# Patient Record
Sex: Female | Born: 1960 | Race: White | Hispanic: No | Smoking: Former smoker
Health system: Southern US, Community
[De-identification: ages and names within clinical notes are randomized; demographics above are authoritative.]

## PROBLEM LIST (undated history)

## (undated) DIAGNOSIS — R0600 Dyspnea, unspecified: Secondary | ICD-10-CM

## (undated) DIAGNOSIS — E559 Vitamin D deficiency, unspecified: Secondary | ICD-10-CM

## (undated) DIAGNOSIS — M170 Bilateral primary osteoarthritis of knee: Secondary | ICD-10-CM

## (undated) DIAGNOSIS — E785 Hyperlipidemia, unspecified: Secondary | ICD-10-CM

## (undated) HISTORY — PX: TUBAL LIGATION: SHX77

## (undated) HISTORY — DX: Bilateral primary osteoarthritis of knee: M17.0

## (undated) HISTORY — PX: TOTAL HIP ARTHROPLASTY: SHX124

## (undated) HISTORY — DX: Vitamin D deficiency, unspecified: E55.9

## (undated) HISTORY — DX: Hyperlipidemia, unspecified: E78.5

---

## 1983-07-15 HISTORY — PX: TUBAL LIGATION: SHX77

## 2017-11-24 DIAGNOSIS — M179 Osteoarthritis of knee, unspecified: Secondary | ICD-10-CM | POA: Insufficient documentation

## 2019-07-13 ENCOUNTER — Other Ambulatory Visit: Payer: Self-pay | Admitting: Primary Care

## 2019-07-13 DIAGNOSIS — R42 Dizziness and giddiness: Secondary | ICD-10-CM

## 2019-07-20 ENCOUNTER — Ambulatory Visit
Admission: RE | Admit: 2019-07-20 | Discharge: 2019-07-20 | Disposition: A | Discharge status: Home / Self Care | Payer: Medicaid Other | Source: Ambulatory Visit | Attending: Primary Care | Admitting: Primary Care

## 2019-07-20 ENCOUNTER — Other Ambulatory Visit: Payer: Self-pay

## 2019-07-20 DIAGNOSIS — R42 Dizziness and giddiness: Secondary | ICD-10-CM

## 2019-08-03 ENCOUNTER — Ambulatory Visit: Payer: Medicaid Other | Admitting: Internal Medicine

## 2019-08-17 ENCOUNTER — Encounter: Payer: Self-pay | Admitting: Internal Medicine

## 2019-08-17 ENCOUNTER — Other Ambulatory Visit: Payer: Self-pay

## 2019-08-17 ENCOUNTER — Ambulatory Visit (INDEPENDENT_AMBULATORY_CARE_PROVIDER_SITE_OTHER): Payer: Medicaid Other | Admitting: Internal Medicine

## 2019-08-17 VITALS — BP 140/84 | HR 76 | Ht 62.0 in | Wt 260.8 lb

## 2019-08-17 DIAGNOSIS — R079 Chest pain, unspecified: Secondary | ICD-10-CM | POA: Diagnosis not present

## 2019-08-17 DIAGNOSIS — E785 Hyperlipidemia, unspecified: Secondary | ICD-10-CM | POA: Diagnosis not present

## 2019-08-17 DIAGNOSIS — I6523 Occlusion and stenosis of bilateral carotid arteries: Secondary | ICD-10-CM

## 2019-08-17 DIAGNOSIS — Z13220 Encounter for screening for lipoid disorders: Secondary | ICD-10-CM | POA: Insufficient documentation

## 2019-08-17 DIAGNOSIS — R0789 Other chest pain: Secondary | ICD-10-CM | POA: Diagnosis not present

## 2019-08-17 DIAGNOSIS — R42 Dizziness and giddiness: Secondary | ICD-10-CM | POA: Insufficient documentation

## 2019-08-17 MED ORDER — ASPIRIN EC 81 MG PO TBEC: 81.0000 mg | DELAYED_RELEASE_TABLET | Freq: Every day | ORAL | 3 refills | Status: DC

## 2019-08-17 NOTE — Patient Instructions (Signed)
Medication Instructions:  Your physician has recommended you make the following change in your medication:  1- START Aspirin 81 mg by mouth once a day. (over-the-counter).  *If you need a refill on your cardiac medications before your next appointment, please call your pharmacy*  Lab Work: Your physician recommends that you return for lab work in: TODAY - LIPID, Direct LDL.  If you have labs (blood work) drawn today and your tests are completely normal, you will receive your results only by: Marland Kitchen MyChart Message (if you have MyChart) OR . A paper copy in the mail If you have any lab test that is abnormal or we need to change your treatment, we will call you to review the results.  Testing/Procedures: Your physician has requested that you have an echocardiogram. Echocardiography is a painless test that uses sound waves to create images of your heart. It provides your doctor with information about the size and shape of your heart and how well your heart's chambers and valves are working. This procedure takes approximately one hour. There are no restrictions for this procedure. You may get an IV, if needed, to receive an ultrasound enhancing agent through to better visualize your heart.   Follow-Up: At [facility], you and your health needs are our priority.  As part of our continuing mission to provide you with exceptional heart care, we have created designated Provider Care Teams.  These Care Teams include your primary Cardiologist (physician) and Advanced Practice Providers (APPs -  Physician Assistants and Nurse Practitioners) who all work together to provide you with the care you need, when you need it.  Your next appointment:   1 month(s)  The format for your next appointment:   In Person  Provider:    You may see one of the following Advanced Practice Providers on your designated Care Team:    Nicolasa Ducking, NP  Eula Listen, PA-C  Marisue Ivan,  PA-C   Echocardiogram An echocardiogram is a procedure that uses painless sound waves (ultrasound) to produce an image of the heart. Images from an echocardiogram can provide important information about:  Signs of coronary artery disease (CAD).  Aneurysm detection. An aneurysm is a weak or damaged part of an artery wall that bulges out from the normal force of blood pumping through the body.  Heart size and shape. Changes in the size or shape of the heart can be associated with certain conditions, including heart failure, aneurysm, and CAD.  Heart muscle function.  Heart valve function.  Signs of a past heart attack.  Fluid buildup around the heart.  Thickening of the heart muscle.  A tumor or infectious growth around the heart valves. Tell a health care provider about:  Any allergies you have.  All medicines you are taking, including vitamins, herbs, eye drops, creams, and over-the-counter medicines.  Any blood disorders you have.  Any surgeries you have had.  Any medical conditions you have.  Whether you are pregnant or may be pregnant. What are the risks? Generally, this is a safe procedure. However, problems may occur, including:  Allergic reaction to dye (contrast) that may be used during the procedure. What happens before the procedure? No specific preparation is needed. You may eat and drink normally. What happens during the procedure?   An IV tube may be inserted into one of your veins.  You may receive contrast through this tube. A contrast is an injection that improves the quality of the pictures from your heart.  A  gel will be applied to your chest.  A wand-like tool (transducer) will be moved over your chest. The gel will help to transmit the sound waves from the transducer.  The sound waves will harmlessly bounce off of your heart to allow the heart images to be captured in real-time motion. The images will be recorded on a computer. The procedure  may vary among health care providers and hospitals. What happens after the procedure?  You may return to your normal, everyday life, including diet, activities, and medicines, unless your health care provider tells you not to do that. Summary  An echocardiogram is a procedure that uses painless sound waves (ultrasound) to produce an image of the heart.  Images from an echocardiogram can provide important information about the size and shape of your heart, heart muscle function, heart valve function, and fluid buildup around your heart.  You do not need to do anything to prepare before this procedure. You may eat and drink normally.  After the echocardiogram is completed, you may return to your normal, everyday life, unless your health care provider tells you not to do that. This information is not intended to replace advice given to you by your health care provider. Make sure you discuss any questions you have with your health care provider. Document Revised: 10/21/2018 Document Reviewed: 08/02/2016 Elsevier Patient Education  [employer].

## 2019-08-17 NOTE — Progress Notes (Signed)
New Outpatient Visit Date: 08/17/2019  Referring Provider: Sandrea Hughs, NP [address] [place],  [place] [address]  Chief Complaint: Dizziness  HPI:  Chloe Farley is a 59 y.o. female who is being seen today for the evaluation of dizziness at the request of Sandrea Hughs, NP. She has a history of hyperlipidemia, vitamin D deficiency, and arthritis.  Chloe Farley was diagnosed with COVID-19 last month and reports that she began feeling dizziness a few weeks before this.  She describes the sensation of lightheadedness and occasional blackening of her vision lasting a few seconds at a time.  She also felt somewhat unstable on her feet, though she never fell or passed out.  There were no associated symptoms including palpitations, chest pain, and shortness of breath.  She had one prolonged episode of dizziness that lasted 2 to 3 days.  She was prescribed meclizine with some transient improvement in the dizziness.  Chloe. Farley denies a history of prior cardiac disease.  She has experienced occasional cramping pain extending from just below the left breast to the left lower abdomen when she bends forward.  She otherwise denies chest pain, including with activity.  She notes that she has gained 23 pounds over the last several months since quitting smoking.  She reports having undergone heart testing during the hospitalization in [place] 5 to 10 years ago.  To her knowledge, the tests were normal.  She has been treated with multiple cholesterol medicines in the past but did not tolerate any of them due to cramping and muscle pain.  Chloe. Farley tries to stay well-hydrated, drinking around 6 bottles of water per day.  She has cut coffee down to 2 cups/day.  She otherwise does not consume caffeine.  She does not have any pain in the left arm at rest or with movement.  --------------------------------------------------------------------------------------------------  Cardiovascular History & Procedures:  Cardiovascular Problems:  Lightheadedness  Risk Factors:  Carotid atherosclerosis, hyperlipidemia, family history, and morbid obesity  Cath/PCI:  None  CV Surgery:  None  EP Procedures and Devices:  None  Non-Invasive Evaluation(s):  Carotid Doppler (07/20/2019): Mild heterogeneous and calcified plaque involving both carotid arteries without hemodynamically significant stenosis.  Suggestion of developing stenosis involving the left subclavian artery.  --------------------------------------------------------------------------------------------------  Past Medical History:  Diagnosis Date  . Hyperlipidemia   . Osteoarthritis of both knees   . Vitamin D deficiency     Past Surgical History:  Procedure Laterality Date  . TOTAL HIP ARTHROPLASTY     right & left   . TUBAL LIGATION      Current Meds  Medication Sig  . Cholecalciferol (DIALYVITE VITAMIN D 5000 PO) Take 10,000 Units by mouth daily.  . Flaxseed, Linseed, (FLAXSEED OIL) 1200 MG CAPS Take 1,200 mg by mouth daily.  Marland Kitchen loratadine (CLARITIN) 10 MG tablet loratadine 10 mg tablet  TAKE 1 TABLET BY MOUTH ONCE DAILY FOR ALLERGIES  . Moringa 500 MG CAPS Take 1,000 mg elemental calcium/kg/hr by mouth daily.  . TURMERIC PO Take by mouth daily.    Allergies: Other, Rofecoxib, and Meloxicam  Social History   Tobacco Use  . Smoking status: Former Smoker    Packs/day: 1.00    Years: 15.00    Pack years: 15.00    Types: Cigarettes    Quit date: 01/31/2019    Years since quitting: 0.5  . Smokeless tobacco: Never Used  Substance Use Topics  . Alcohol use: Never  . Drug use: Not on file  Family History  Problem Relation Age of Onset  . Cancer Mother   . Hyperlipidemia Father   . Heart attack Father   . Hypertension Sister   . Hyperlipidemia Sister   . Head & neck cancer Brother     Review of Systems: A 12-system review of systems was performed and was negative except as noted in the HPI.   --------------------------------------------------------------------------------------------------  Physical Exam: BP 140/84 (BP Location: Right Arm, Patient Position: Sitting, Cuff Size: Large)   Pulse 76   Ht 5\' 2"  (1.575 m)   Wt 260 lb 12 oz (118.3 kg)   SpO2 94%   BMI 47.69 kg/m   Right arm blood pressure: 140/84; left arm blood pressure: 140/86 Position Blood pressure (mmHg) Heart rate (bpm)  Lying  137/79  76  Sitting  110/72  79  Standing  110/74  86  Standing (3 minutes)  117/83  84    General: NAD. HEENT: No conjunctival pallor or scleral icterus. Facemask in place. Neck: Supple without lymphadenopathy, thyromegaly, JVD, or HJR.  Left carotid and shoulder bruit; question if left carotid bruit is radiated from the subclavian artery Lungs: Normal work of breathing. Clear to auscultation bilaterally without wheezes or crackles. Heart: Regular rate and rhythm without murmurs, rubs, or gallops.  Unable to assess PMI due to body habitus. Abd: Bowel sounds present. Soft, NT/ND without.  Unable to assess HSM due to body habitus. Ext: Trace pretibial edema bilaterally. Radial, PT, and DP pulses are 2+ bilaterally Skin: Warm and dry without rash. Neuro: CNIII-XII intact. Strength and fine-touch sensation intact in upper and lower extremities bilaterally. Psych: Normal mood and affect.  EKG: Normal sinus rhythm with possible left atrial enlargement.  Otherwise, no significant abnormality.  Outside labs (06/28/2019): CBC: White blood cell count 9.1, hemoglobin 14.9, hematocrit 45.2, platelets 281  CMP: Sodium 141, potassium 4.7, chloride 99, CO2 25, BUN 16, creatinine 0.92, glucose 83, calcium 9.7, AST 15, ALT 18, alkaline phosphatase 91, total bilirubin 0.4, total protein 7.1, albumin 4.4  TSH: 0.889  --------------------------------------------------------------------------------------------------  ASSESSMENT AND PLAN: Lightheadedness: Symptoms are nonspecific and not  positional, as would be expected with orthostatic hypotension.  Of note, significant drop in blood pressure from lying to sitting was observed in the office today.  Recent carotid Doppler showed mild bilateral carotid artery plaquing as well as potential left subclavian artery stenosis.  I do not believe these findings in isolation explain the patient's dizziness.  I have recommended a transthoracic echocardiogram to exclude structural cardiac abnormalities that could be contributing.  I also encouraged Chloe. Grape to stay well-hydrated.  It is possible that recent COVID-19 infection may have also contributed to her symptoms.  Hopefully, lightheadedness will improve as she recovers from this infection.  Atypical chest pain: Chest pain is positional, occurring primarily when the patient bends over.  This is most consistent with musculoskeletal etiology.  However, given another symptoms outlined above, we will obtain a transthoracic echocardiogram.  I do not believe that ischemia evaluation is necessary at this time, though we will need to readdress this if she has more typical chest pain in the future or evidence of cardiomyopathy on echo.  Carotid artery stenosis and hyperlipidemia: The patient reports a history of hyperlipidemia with intolerance to multiple cholesterol medications in the past.  The only when she remembers my name is atorvastatin.  We do not have a recent lipid panel; we will check a lipid panel today.  I have advised Chloe. Hay that she will likely  need to reconsider lipid therapy, given evidence of atherosclerotic cardiovascular disease (carotid artery and likely left subclavian artery atherosclerosis), though she is resistant to this.  We will await the results of her lipid panel and work on lifestyle modifications in the meantime.  I do not believe that further imaging of the left subclavian artery is needed at this time, given that there is no significant blood pressure differential  between the right and left arms.  She also does not have symptoms of ischemia in the left arm.  I have recommended that Chloe. Brayboy begin taking aspirin 81 mg daily.  Morbid obesity: BMI greater than 40 with comorbidities (ASCVD and hyperlipidemia).  Weight loss encouraged through diet and exercise.  Follow-up: Return to clinic in 1 month.  Yvonne Kendall, MD 08/17/2019 1:31 PM

## 2019-08-18 LAB — LDL CHOLESTEROL, DIRECT: LDL Direct: 212 mg/dL — ABNORMAL HIGH (ref 0–99)

## 2019-08-18 LAB — LIPID PANEL
Chol/HDL Ratio: 4.3 ratio (ref 0.0–4.4)
Cholesterol, Total: 290 mg/dL — ABNORMAL HIGH (ref 100–199)
HDL: 67 mg/dL (ref >39)
LDL Chol Calc (NIH): 201 mg/dL — ABNORMAL HIGH (ref 0–99)
Triglycerides: 124 mg/dL (ref 0–149)
VLDL Cholesterol Cal: 22 mg/dL (ref 5–40)

## 2019-09-07 ENCOUNTER — Other Ambulatory Visit: Payer: Self-pay

## 2019-09-07 ENCOUNTER — Ambulatory Visit (INDEPENDENT_AMBULATORY_CARE_PROVIDER_SITE_OTHER): Payer: Medicaid Other

## 2019-09-07 DIAGNOSIS — R42 Dizziness and giddiness: Secondary | ICD-10-CM | POA: Diagnosis not present

## 2019-09-07 DIAGNOSIS — R0789 Other chest pain: Secondary | ICD-10-CM

## 2019-09-07 MED ORDER — PERFLUTREN LIPID MICROSPHERE: 1.0000 mL | INTRAVENOUS | Status: AC | PRN

## 2019-09-22 ENCOUNTER — Encounter: Payer: Self-pay | Admitting: Internal Medicine

## 2019-09-22 ENCOUNTER — Telehealth: Payer: Self-pay | Admitting: Internal Medicine

## 2019-09-22 ENCOUNTER — Other Ambulatory Visit: Payer: Self-pay

## 2019-09-22 ENCOUNTER — Ambulatory Visit (INDEPENDENT_AMBULATORY_CARE_PROVIDER_SITE_OTHER): Payer: Medicaid Other | Admitting: Internal Medicine

## 2019-09-22 VITALS — BP 110/60 | HR 89 | Ht 62.0 in | Wt 264.8 lb

## 2019-09-22 DIAGNOSIS — E7800 Pure hypercholesterolemia, unspecified: Secondary | ICD-10-CM | POA: Diagnosis not present

## 2019-09-22 DIAGNOSIS — Z79899 Other long term (current) drug therapy: Secondary | ICD-10-CM

## 2019-09-22 DIAGNOSIS — I2510 Atherosclerotic heart disease of native coronary artery without angina pectoris: Secondary | ICD-10-CM | POA: Diagnosis not present

## 2019-09-22 MED ORDER — BEMPEDOIC ACID 180 MG PO TABS: 1.0000 | ORAL_TABLET | Freq: Every day | ORAL | 5 refills | Status: DC

## 2019-09-22 NOTE — Progress Notes (Signed)
Follow-up Outpatient Visit Date: 09/22/2019  Primary Care Provider: Freddy Finner, NP [place] [address]  Chief Complaint: Follow-up dizziness  HPI:  Chloe Farley is a 59 y.o. female with history of hyperlipidemia, vitamin D deficiency, and arthritis, who presents for follow-up of dizziness.  I met her a month ago for evaluation of dizziness.  Shortly after onset of symptoms, she was diagnosed with COVID-19.  Orthostatic hypotension was noted in the office.  Preceding carotid Doppler was notable for mild bilateral carotid artery plaquing as well as potential stenosis in the left subclavian artery.  Subsequent echo showed preserved LVEF with grade 1 diastolic dysfunction and mildly elevated PA pressure.  No significant abnormality was seen to explain dizziness, though on my personal review of the echo, at least mild aortic regurgitation is noted.  Today, Ms. Baumert reports that her dizziness has improved.  She denies chest pain, shortness of breath, palpitations, lightheadedness, edema, and arm pain.  She notes that she has taken several statins in the past, including atorvastatin and rosuvastatin, and did not tolerate any of them due to severe myalgias involving her legs and feet.  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  Risk Factors:  Carotid atherosclerosis, hyperlipidemia, family history, and morbid obesity  Cath/PCI:  None  CV Surgery:  None  EP Procedures and Devices:  None  Non-Invasive Evaluation(s):  TTE (09/07/2019): Normal LV size and wall thickness.  LVEF 60-65% with grade 1 diastolic dysfunction.  Normal RV size and function with mildly elevated pulmonary artery pressure.  There is probably at  least mild aortic regurgitation.  Carotid Doppler (07/20/2019): Mild heterogeneous and calcified plaque involving both carotid arteries without hemodynamically significant stenosis.  Suggestion of developing stenosis involving the left subclavian artery.  Recent CV Pertinent Labs: Lab Results  Component Value Date   CHOL 290 (H) 08/17/2019   HDL 67 08/17/2019   LDLCALC 201 (H) 08/17/2019   LDLDIRECT 212 (H) 08/17/2019   TRIG 124 08/17/2019   CHOLHDL 4.3 08/17/2019    Past medical and surgical history were reviewed and updated in EPIC.  Current Meds  Medication Sig  . aspirin EC 81 MG tablet Take 1 tablet (81 mg total) by mouth daily.  . Cholecalciferol (DIALYVITE VITAMIN D 5000 PO) Take 5,000 Units by mouth daily. Taking 1 capsule daily.  . Flaxseed, Linseed, (FLAXSEED OIL) 1200 MG CAPS Take 1,200 mg by mouth daily.  Marland Kitchen loratadine (CLARITIN) 10 MG tablet loratadine 10 mg tablet  TAKE 1 TABLET BY MOUTH ONCE DAILY FOR ALLERGIES  . Moringa 500 MG CAPS Take 1,000 mg elemental calcium/kg/hr by mouth daily.  . TURMERIC PO Take by mouth daily.    Allergies: Other, Rofecoxib, and Meloxicam  Social History   Tobacco Use  . Smoking status: Former Smoker    Packs/day: 1.00    Years: 15.00    Pack years: 15.00    Types: Cigarettes    Quit date: 01/31/2019    Years since quitting: 0.6  . Smokeless tobacco: Never Used  Substance Use Topics  . Alcohol use: Never  . Drug use: Not on file    Family History  Problem Relation Age of Onset  . Cancer Mother   . Hyperlipidemia Father   . Heart attack Father   . Hypertension Sister   . Hyperlipidemia Sister   . Head & neck cancer Brother  Review of Systems: A 12-system review of systems was performed and was negative except as noted in the HPI.  --------------------------------------------------------------------------------------------------  Physical Exam: BP 110/60 (BP Location: Left Arm, Patient Position: Sitting, Cuff Size:  Normal)   Pulse 89   Ht _0  (1.575 m)   Wt 264 lb 12 oz (120.1 kg)   SpO2 95%   BMI 48.42 kg/m   General: NAD. Neck: No JVD or HJR, though evaluation is limited by body habitus. Lungs: Clear bilaterally without wheezes or crackles. Heart: Distant heart sounds.  Regular rate and rhythm without murmurs. Abdomen: Obese and soft. Extremities: No lower extremity edema.  2+ radial pulses bilaterally.  EKG: Normal sinus rhythm without abnormality.  Lab Results  Component Value Date   CHOL 290 (H) 08/17/2019   HDL 67 08/17/2019   LDLCALC 201 (H) 08/17/2019   LDLDIRECT 212 (H) 08/17/2019   TRIG 124 08/17/2019   CHOLHDL 4.3 08/17/2019    --------------------------------------------------------------------------------------------------  ASSESSMENT AND PLAN: ASCVD: Ms. Foell is asymptomatic but has been noted to have mild carotid artery plaquing as well as likely stenosis involving the left subclavian artery with Doppler showing turbulent flow and late systolic reversal in the left vertebral artery.  She is asymptomatic.  However, we discussed the importance of medical therapy and lifestyle modifications to prevent progression of disease.  Lipid panel after our last visit was notable for marked hyperlipidemia with a direct LDL of 212.  This is consistent with heterozygous familial hyperlipidemia.  Given that there is clinical evidence of developing ASCVD as well, I have recommended aggressive lipid therapy.  Ms. Depaul notes that she has tried multiple statins in the past and was intolerant of all of them, including atorvastatin and rosuvastatin.  We discussed alternative treatment options including ezetimibe, PCSK9 inhibitor, and bempedoic acid.  I do not believe that ezetimibe will provide adequate LDL lowering to significantly decrease her cardiovascular risk.  Ms. Casebolt is resistant to an injectable medication.  We will therefore initiate bempedoic acid 180 mg daily with follow-up lipid  panel, ALT, and CBC 6 weeks after beginning therapy.  We will continue with aspirin 81 mg daily.  Familial hyperlipidemia: As above, recent direct LDL was elevated at 212.  She notes that her father also had severely elevated cholesterol complicated by coronary artery disease and MI.  We will initiate bempedoic acid given history of intolerance to multiple statins.  Repeat labs to be done in 6 weeks after initiation of therapy.  Morbid obesity: BMI greater than 40.  I have encouraged Ms. Ruperto to lose weight through diet and exercise.  Follow-up: Return to clinic in 3 months.  Nelva Bush, MD 09/23/2019 2:55 PM

## 2019-09-22 NOTE — Patient Instructions (Signed)
Medication Instructions:  Your physician has recommended you make the following change in your medication:  1. Start Bempedoic Acid 180 mg take one tablet by mouth once daily.  *If you need a refill on your cardiac medications before your next appointment, please call your pharmacy*   Lab Work: Your physician recommends that you return for lab work in: Six weeks after you start taking the Bempedoic Acid. Come to the medical mall. No appointment needed and the hours are 7am-6pm. CBC, ALT, LIPID  If you have labs (blood work) drawn today and your tests are completely normal, you will receive your results only by: Marland Kitchen MyChart Message (if you have MyChart) OR . A paper copy in the mail If you have any lab test that is abnormal or we need to change your treatment, we will call you to review the results.   Testing/Procedures:  None   Follow-Up: At [facility], you and your health needs are our priority.  As part of our continuing mission to provide you with exceptional heart care, we have created designated Provider Care Teams.  These Care Teams include your primary Cardiologist (physician) and Advanced Practice Providers (APPs -  Physician Assistants and Nurse Practitioners) who all work together to provide you with the care you need, when you need it.  We recommend signing up for the patient portal called "MyChart".  Sign up information is provided on this After Visit Summary.  MyChart is used to connect with patients for Virtual Visits (Telemedicine).  Patients are able to view lab/test results, encounter notes, upcoming appointments, etc.  Non-urgent messages can be sent to your provider as well.   To learn more about what you can do with MyChart, go to [email or web address].    Your next appointment:   3 month(s)  The format for your next appointment:   In Person  Provider:    You may see Dr End or one of the following Advanced Practice Providers on your designated Care Team:     Nicolasa Ducking, NP  Eula Listen, PA-C  Marisue Ivan, PA-C

## 2019-09-22 NOTE — Telephone Encounter (Signed)
No answer with pharmacy. Left message to call back.

## 2019-09-22 NOTE — Telephone Encounter (Signed)
Pt c/o medication issue:  1. Name of Medication: bempedoic acid 180 mg po q d   2. How are you currently taking this medication (dosage and times per day)?  180 mg po q d   3. Are you having a reaction (difficulty breathing--STAT)? No  4. What is your medication issue? Pharmacy calling to let us know insurance will not cover and they do not stock

## 2019-09-23 ENCOUNTER — Encounter: Payer: Self-pay | Admitting: Internal Medicine

## 2019-09-23 MED ORDER — BEMPEDOIC ACID 180 MG PO TABS: 1.0000 | ORAL_TABLET | Freq: Every day | ORAL | 1 refills | Status: DC

## 2019-09-23 NOTE — Telephone Encounter (Signed)
Discussed with Malena Peer, PharmD, who advised to resend to pharmacy and do the prior auth through [facility] when ready.  Rx sent. Await prior auth.

## 2019-09-23 NOTE — Telephone Encounter (Signed)
No answer with [facility]. Left message to call back.   Called patient to see if there is another pharmacy I can send bempedoic acid to since [facility] does not stock. She said the [facility] in [place].  Advised that it may take some time to go through the process and to check back with me in about 1 week.  Patient is on Medicaid. Routing to pharmacy for advice on this process.

## 2019-09-26 NOTE — Telephone Encounter (Signed)
Spoke with pharmacy - [facility]. She ran her [employer] and it does show the need to complete prior auth through [employer]. She is faxing [place] the request now.

## 2019-09-26 NOTE — Telephone Encounter (Signed)
Spoke with Harvest Dark at [facility] to initiate Prior Auth for nexletol 180mg . Interaction ID# PA has been approved effective 09/26/2019 through 09/20/2020. PA approval # 11/20/2020 Pal at [facility] has been informed. ($3 Copay) They will order the medication and have it ready for pickup tomorrow afternoon. Pal states she will call the patient to let her know.

## 2020-01-10 NOTE — Progress Notes (Signed)
Follow-up Outpatient Visit Date: 01/11/2020  Primary Care Provider: Sandrea Hughs, NP [address] [place] [address]  Chief Complaint: Follow-up hyperlipidemia and dizziness  HPI:  Ms. Overholser is a 59 y.o. female with history of hyperlipidemia, vitamin D deficiency, and arthritis, who presents for follow-up of hyperlipidemia.  I last saw her in March, at which time Ms. Charise Killian noted that her dizziness had improved.  I recommended initiation of lipid therapy given mild plaquing in the carotid arteries as well as potential left subclavian artery stenosis in the setting of severely elevated LDL.  Ms. Charise Killian has been intolerant of multiple statins and did not wish to be rechallenged.  She was also reluctant to an injectable medication.  We therefore elected to add bempedoic acid.  Today, Ms. Steinmiller reports that her dizziness has resolved with addition of low-dose aspirin.  She stopped taking bempedoic acid after about a week, as she felt like it was causing leg cramps.  She had to apply several topical treatments to help with the leg cramps, though they ultimately seem to resolve when she began taking turmeric.  She does not wish to rechallenge with any lipid therapy at this time.  She is walking about 10 to 15 minutes a day but admits that she does not follow a healthy diet.  Ms. Abdulaziz denies chest pain, shortness of breath, palpitations, lightheadedness, and edema.  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  Risk Factors:  Carotid atherosclerosis, hyperlipidemia, family history, and morbid obesity  Cath/PCI:  None  CV Surgery:  None  EP Procedures and Devices:  None  Non-Invasive Evaluation(s):  TTE (09/07/2019): Normal LV  size and wall thickness.  LVEF 60-65% with grade 1 diastolic dysfunction.  Normal RV size and function with mildly elevated pulmonary artery pressure.  There is probably at least mild aortic regurgitation.  Carotid Doppler (07/20/2019): Mild heterogeneous and calcified plaque involving both carotid arteries without hemodynamically significant stenosis. Suggestion of developing stenosis involving the left subclavian artery.  Recent CV Pertinent Labs: Lab Results  Component Value Date   CHOL 290 (H) 08/17/2019   HDL 67 08/17/2019   LDLCALC 201 (H) 08/17/2019   LDLDIRECT 212 (H) 08/17/2019   TRIG 124 08/17/2019   CHOLHDL 4.3 08/17/2019    Past medical and surgical history were reviewed and updated in EPIC.  Current Meds  Medication Sig  . aspirin EC 81 MG tablet Take 1 tablet (81 mg total) by mouth daily.  . Flaxseed, Linseed, (FLAXSEED OIL) 1200 MG CAPS Take 1,200 mg by mouth daily.  . TURMERIC PO Take 2 capsules by mouth daily.     Allergies: Other, Rofecoxib, Sulfa antibiotics, and Meloxicam  Social History   Tobacco Use  . Smoking status: Former Smoker    Packs/day: 1.00    Years: 15.00    Pack years: 15.00    Types: Cigarettes    Quit date: 01/31/2019    Years since quitting: 0.9  . Smokeless tobacco: Never Used  Vaping Use  . Vaping Use: Never used  Substance Use Topics  . Alcohol use: Never  . Drug use: Not on file    Family History  Problem Relation Age of Onset  . Cancer Mother   . Hyperlipidemia Father   . Heart attack Father   . Hypertension Sister   . Hyperlipidemia Sister   . Head & neck cancer Brother  Review of Systems: A 12-system review of systems was performed and was negative except as noted in the HPI.  --------------------------------------------------------------------------------------------------  Physical Exam: BP 120/64 (BP Location: Left Arm, Patient Position: Sitting, Cuff Size: Large)   Pulse 73   Ht 5\' 3"  (1.6 m)   Wt 257 lb 6  oz (116.7 kg)   SpO2 98%   BMI 45.59 kg/m   General: NAD. Neck: No JVD or HJR. Lungs: Clear to auscultation without wheezes or crackles. Heart: Regular rate and rhythm without murmurs, rubs, or gallops. Abdomen: Soft, nontender, nondistended. Extremities: No lower extremity edema.  2+ pedal pulses bilaterally.  EKG: Normal sinus rhythm without abnormality.  Lab Results  Component Value Date   CHOL 290 (H) 08/17/2019   HDL 67 08/17/2019   LDLCALC 201 (H) 08/17/2019   LDLDIRECT 212 (H) 08/17/2019   TRIG 124 08/17/2019   CHOLHDL 4.3 08/17/2019    --------------------------------------------------------------------------------------------------  ASSESSMENT AND PLAN: Hyperlipidemia: Unfortunately, Ms. Wassel remains intolerant of multiple agents to control her lipids, including recent trial of bempedoic acid.  I am not certain that her intermittent leg cramps, particularly at night, are attributable to bempedoic acid.  We discussed rechallenging her with this versus trying a PCSK9 inhibitor, though Ms. Inclan declines both options.  She would like to work on diet and exercise.  I have provided her with information regarding the Mediterranean diet.  Given history of markedly elevated LDL, left subclavian artery stenosis, and carotid artery plaquing, she is at high risk for future cardiovascular events if her LDL remains uncontrolled.  We will plan to check a follow-up lipid panel to assess her progress in 6 months.  Leg cramps: No evidence of PAD by exam today.  Nature of discomfort is also inconsistent with high-grade PAD.  We discussed obtaining ABIs but have agreed to defer this.  I will check a basic metabolic panel and magnesium level today to ensure normal electrolytes.  I encouraged Ms. Tribby to stay well-hydrated.  Lightheadedness: Symptoms have resolved with addition of low-dose aspirin.  Work-up thus far showed mild carotid stenosis and normal LVEF.  If symptoms were to recur,  we may need to investigate her left subclavian artery stenosis further, as dizziness can sometimes be caused by subclavian steal.  However, given the lack of ongoing symptoms, we have agreed to defer additional work-up at this time.  Morbid obesity: BMI greater than 40.  I encouraged weight loss through diet and exercise.  Follow-up: Return to clinic in 6 months.  Cleora Fleet, MD 01/11/2020 7:59 AM

## 2020-01-11 ENCOUNTER — Ambulatory Visit: Payer: Medicaid Other | Admitting: Internal Medicine

## 2020-01-11 ENCOUNTER — Encounter: Payer: Self-pay | Admitting: Internal Medicine

## 2020-01-11 ENCOUNTER — Other Ambulatory Visit: Payer: Self-pay

## 2020-01-11 VITALS — BP 120/64 | HR 73 | Ht 63.0 in | Wt 257.4 lb

## 2020-01-11 DIAGNOSIS — R42 Dizziness and giddiness: Secondary | ICD-10-CM | POA: Diagnosis not present

## 2020-01-11 DIAGNOSIS — E7800 Pure hypercholesterolemia, unspecified: Secondary | ICD-10-CM

## 2020-01-11 DIAGNOSIS — R252 Cramp and spasm: Secondary | ICD-10-CM | POA: Diagnosis not present

## 2020-01-11 NOTE — Patient Instructions (Addendum)
Medication Instructions:  Your physician recommends that you continue on your current medications as directed. Please refer to the Current Medication list given to you today.  *If you need a refill on your cardiac medications before your next appointment, please call your pharmacy*   Lab Work: 1- Your physician recommends that you return for lab work in: TODAY - BMET, Mg.   2- Your physician recommends that you return for lab work in: 6 MONTHS PRIOR TO 6 MONTH APPT.  - You will need to be fasting. Please do not have anything to eat or drink after midnight the morning you have the lab work. You may only have water or black coffee with no cream or sugar. - Please go to the [facility] You will check in at the front desk to the right as you walk into the atrium. Valet Parking is offered if needed. - No appointment needed. You may go any day between 7 am and 6 pm.  If you have labs (blood work) drawn today and your tests are completely normal, you will receive your results only by: Marland Kitchen MyChart Message (if you have MyChart) OR . A paper copy in the mail If you have any lab test that is abnormal or we need to change your treatment, we will call you to review the results.   Testing/Procedures: none   Follow-Up: At [facility], you and your health needs are our priority.  As part of our continuing mission to provide you with exceptional heart care, we have created designated Provider Care Teams.  These Care Teams include your primary Cardiologist (physician) and Advanced Practice Providers (APPs -  Physician Assistants and Nurse Practitioners) who all work together to provide you with the care you need, when you need it.  We recommend signing up for the patient portal called "MyChart".  Sign up information is provided on this After Visit Summary.  MyChart is used to connect with patients for Virtual Visits (Telemedicine).  Patients are able to view lab/test results, encounter notes,  upcoming appointments, etc.  Non-urgent messages can be sent to your provider as well.   To learn more about what you can do with MyChart, go to [email or web address].    Your next appointment:   6 month(s)  - Please make sure to go to the [facility] within the week prior to your appointment for Lab work as listed above.  The format for your next appointment:   In Person  Provider:    You may see DR Cristal Deer END  or one of the following Advanced Practice Providers on your designated Care Team:    Nicolasa Ducking, NP  Eula Listen, PA-C  Marisue Ivan, PA-C

## 2020-01-12 LAB — BASIC METABOLIC PANEL
BUN/Creatinine Ratio: 19 (ref 12–28)
BUN: 18 mg/dL (ref 8–27)
CO2: 22 mmol/L (ref 20–29)
Calcium: 9 mg/dL (ref 8.7–10.3)
Chloride: 106 mmol/L (ref 96–106)
Creatinine, Ser: 0.94 mg/dL (ref 0.57–1.00)
GFR calc Af Amer: 76 mL/min/{1.73_m2} (ref >59)
GFR calc non Af Amer: 66 mL/min/{1.73_m2} (ref >59)
Glucose: 97 mg/dL (ref 65–99)
Potassium: 4.5 mmol/L (ref 3.5–5.2)
Sodium: 141 mmol/L (ref 134–144)

## 2020-01-12 LAB — MAGNESIUM: Magnesium: 2.2 mg/dL (ref 1.6–2.3)

## 2020-04-30 ENCOUNTER — Other Ambulatory Visit: Payer: Self-pay | Admitting: Primary Care

## 2020-04-30 DIAGNOSIS — Z1231 Encounter for screening mammogram for malignant neoplasm of breast: Secondary | ICD-10-CM

## 2020-06-21 ENCOUNTER — Ambulatory Visit
Admission: RE | Admit: 2020-06-21 | Discharge: 2020-06-21 | Disposition: A | Discharge status: Home / Self Care | Payer: Medicaid Other | Source: Ambulatory Visit | Attending: Primary Care | Admitting: Primary Care

## 2020-06-21 ENCOUNTER — Other Ambulatory Visit: Payer: Self-pay

## 2020-06-21 DIAGNOSIS — Z1231 Encounter for screening mammogram for malignant neoplasm of breast: Secondary | ICD-10-CM

## 2020-08-06 ENCOUNTER — Other Ambulatory Visit: Payer: Self-pay

## 2020-08-06 ENCOUNTER — Other Ambulatory Visit
Admission: RE | Admit: 2020-08-06 | Discharge: 2020-08-06 | Disposition: A | Discharge status: Home / Self Care | Payer: Medicaid Other | Source: Ambulatory Visit | Attending: Internal Medicine | Admitting: Internal Medicine

## 2020-08-06 DIAGNOSIS — Z20822 Contact with and (suspected) exposure to covid-19: Secondary | ICD-10-CM | POA: Insufficient documentation

## 2020-08-06 DIAGNOSIS — Z01812 Encounter for preprocedural laboratory examination: Secondary | ICD-10-CM | POA: Insufficient documentation

## 2020-08-06 LAB — SARS CORONAVIRUS 2 (TAT 6-24 HRS): SARS Coronavirus 2: NEGATIVE

## 2020-08-07 ENCOUNTER — Encounter: Payer: Self-pay | Admitting: Internal Medicine

## 2020-08-08 ENCOUNTER — Encounter: Payer: Self-pay | Admitting: Anesthesiology

## 2020-08-08 ENCOUNTER — Ambulatory Visit: Admission: RE | Admit: 2020-08-08 | Payer: Medicaid Other | Source: Home / Self Care | Admitting: Internal Medicine

## 2020-08-08 ENCOUNTER — Encounter: Admission: RE | Payer: Self-pay | Source: Home / Self Care

## 2020-08-08 SURGERY — COLONOSCOPY WITH PROPOFOL
Anesthesia: General

## 2020-08-08 MED ORDER — PROPOFOL 500 MG/50ML IV EMUL
INTRAVENOUS | Status: AC
  Filled 2020-08-08: qty 50

## 2020-08-23 ENCOUNTER — Telehealth: Payer: Self-pay | Admitting: Internal Medicine

## 2020-08-23 NOTE — Telephone Encounter (Signed)
Patient has been contacted at least 3 times for a recall, recall has been deleted  

## 2021-03-10 ENCOUNTER — Other Ambulatory Visit: Payer: Self-pay

## 2021-03-10 ENCOUNTER — Emergency Department
Admission: EM | Admit: 2021-03-10 | Discharge: 2021-03-10 | Disposition: A | Discharge status: Home / Self Care | Payer: Medicaid Other | Attending: Emergency Medicine | Admitting: Emergency Medicine

## 2021-03-10 ENCOUNTER — Emergency Department: Payer: Medicaid Other

## 2021-03-10 DIAGNOSIS — Z7982 Long term (current) use of aspirin: Secondary | ICD-10-CM | POA: Insufficient documentation

## 2021-03-10 DIAGNOSIS — Z96643 Presence of artificial hip joint, bilateral: Secondary | ICD-10-CM | POA: Insufficient documentation

## 2021-03-10 DIAGNOSIS — R748 Abnormal levels of other serum enzymes: Secondary | ICD-10-CM | POA: Diagnosis not present

## 2021-03-10 DIAGNOSIS — R1084 Generalized abdominal pain: Secondary | ICD-10-CM | POA: Diagnosis not present

## 2021-03-10 DIAGNOSIS — Z87891 Personal history of nicotine dependence: Secondary | ICD-10-CM | POA: Diagnosis not present

## 2021-03-10 LAB — COMPREHENSIVE METABOLIC PANEL
ALT: 18 U/L (ref 0–44)
AST: 19 U/L (ref 15–41)
Albumin: 4.1 g/dL (ref 3.5–5.0)
Alkaline Phosphatase: 76 U/L (ref 38–126)
Anion gap: 7 (ref 5–15)
BUN: 20 mg/dL (ref 8–23)
CO2: 24 mmol/L (ref 22–32)
Calcium: 9 mg/dL (ref 8.9–10.3)
Chloride: 106 mmol/L (ref 98–111)
Creatinine, Ser: 1.22 mg/dL — ABNORMAL HIGH (ref 0.44–1.00)
GFR, Estimated: 50 mL/min — ABNORMAL LOW (ref >60)
Glucose, Bld: 110 mg/dL — ABNORMAL HIGH (ref 70–99)
Potassium: 4.2 mmol/L (ref 3.5–5.1)
Sodium: 137 mmol/L (ref 135–145)
Total Bilirubin: 0.9 mg/dL (ref 0.3–1.2)
Total Protein: 7 g/dL (ref 6.5–8.1)

## 2021-03-10 LAB — LIPASE, BLOOD: Lipase: 520 U/L — ABNORMAL HIGH (ref 11–51)

## 2021-03-10 LAB — URINALYSIS, COMPLETE (UACMP) WITH MICROSCOPIC
Bacteria, UA: NONE SEEN
Bilirubin Urine: NEGATIVE
Glucose, UA: NEGATIVE mg/dL
Hgb urine dipstick: NEGATIVE
Ketones, ur: NEGATIVE mg/dL
Leukocytes,Ua: NEGATIVE
Nitrite: NEGATIVE
Protein, ur: NEGATIVE mg/dL
Specific Gravity, Urine: 1.008 (ref 1.005–1.030)
pH: 5 (ref 5.0–8.0)

## 2021-03-10 LAB — CBC
HCT: 42.9 % (ref 36.0–46.0)
Hemoglobin: 14.2 g/dL (ref 12.0–15.0)
MCH: 29.6 pg (ref 26.0–34.0)
MCHC: 33.1 g/dL (ref 30.0–36.0)
MCV: 89.4 fL (ref 80.0–100.0)
Platelets: 261 10*3/uL (ref 150–400)
RBC: 4.8 MIL/uL (ref 3.87–5.11)
RDW: 12.8 % (ref 11.5–15.5)
WBC: 12 10*3/uL — ABNORMAL HIGH (ref 4.0–10.5)
nRBC: 0 % (ref 0.0–0.2)

## 2021-03-10 MED ORDER — SODIUM CHLORIDE 0.9 % IV BOLUS
1000.0000 mL | Freq: Once | INTRAVENOUS | Status: AC
  Administered 2021-03-10: 1000 mL via INTRAVENOUS

## 2021-03-10 MED ORDER — IOHEXOL 350 MG/ML SOLN
100.0000 mL | Freq: Once | INTRAVENOUS | Status: AC | PRN
  Administered 2021-03-10: 100 mL via INTRAVENOUS

## 2021-03-10 NOTE — ED Triage Notes (Signed)
Pt comes pov with middle abd pain. Feels like pressure. Unable to put her underwear on because it makes it worse. States that she feels like she hasn't gone to the bathroom "right" since her colonoscopy in July.

## 2021-03-10 NOTE — Discharge Instructions (Addendum)
Please avoid any tight fitting pants or undergarments until follow-up with the gastroenterologist

## 2021-03-10 NOTE — ED Provider Notes (Signed)
[facility] Emergency Department Provider Note   ____________________________________________   Event Date/Time   First MD Initiated Contact with Patient 03/10/21 1244     (approximate)  I have reviewed the triage vital signs and the nursing notes.   HISTORY  Chief Complaint Abdominal Pain    HPI Chloe Farley is a 60 y.o. female who presents for generalized abdominal pain  LOCATION: Generalized abdomen DURATION: 1 day TIMING: Slightly improved since onset SEVERITY: Severe QUALITY: Pressure CONTEXT: Patient states that when she attempted to put on her underwear last night she felt that the elastic waistband was causing her severe abdominal pain.  Upon awakening this morning, she attempted to put on her pants to go to church and found the same thing to be happening. MODIFYING FACTORS: Any circumferential pressure around her abdomen worsens this pain and it is almost completely resolved when her abdomen is not being pressed on ASSOCIATED SYMPTOMS: Mild nausea   Per medical record review, patient has history of morbid obesity          Past Medical History:  Diagnosis Date   Hyperlipidemia    Osteoarthritis of both knees    Vitamin D deficiency     Patient Active Problem List   Diagnosis Date Noted   Leg cramp 01/11/2020   Lightheadedness 08/17/2019   Atypical chest pain 08/17/2019   Bilateral carotid artery stenosis 08/17/2019   Hyperlipidemia 08/17/2019   Morbid obesity (HCC) 08/17/2019   Osteoarthritis of knee 11/24/2017    Past Surgical History:  Procedure Laterality Date   TOTAL HIP ARTHROPLASTY     right & left    TUBAL LIGATION      Prior to Admission medications   Medication Sig Start Date End Date Taking? Authorizing Provider  aspirin EC 81 MG tablet Take 1 tablet (81 mg total) by mouth daily. 08/17/19   End, Cristal Deer, MD  B Complex-C (B-COMPLEX WITH VITAMIN C) tablet Take 1 tablet by mouth daily. For leg cramps     [provider]  diphenhydrAMINE (BENADRYL) 25 MG tablet Take 12.5 mg by mouth at bedtime as needed (leg cramps).    [provider]  Flaxseed, Linseed, (FLAXSEED OIL) 1200 MG CAPS Take 1,200 mg by mouth daily.    [provider]  loratadine (CLARITIN) 10 MG tablet Take 10 mg by mouth daily.    [provider]  TURMERIC PO Take 2 capsules by mouth daily.     [provider]    Allergies Other, Rofecoxib, Sulfa antibiotics, Depakote [divalproex sodium], and Meloxicam  Family History  Problem Relation Age of Onset   Cancer Mother    Hyperlipidemia Father    Heart attack Father    Hypertension Sister    Hyperlipidemia Sister    Head & neck cancer Brother     Social History Social History   Tobacco Use   Smoking status: Former    Packs/day: 1.00    Years: 15.00    Pack years: 15.00    Types: Cigarettes    Quit date: 04/13/2020    Years since quitting: 0.9   Smokeless tobacco: Never  Vaping Use   Vaping Use: Never used  Substance Use Topics   Alcohol use: Never    Review of Systems Constitutional: No fever/chills Eyes: No visual changes. ENT: No sore throat. Cardiovascular: Denies chest pain. Respiratory: Denies shortness of breath. Gastrointestinal: Endorses abdominal pain and nausea.  No vomiting.  No diarrhea. Genitourinary: Negative for dysuria. Musculoskeletal: Negative for  acute arthralgias Skin: Negative for rash. Neurological: Negative for headaches, weakness/numbness/paresthesias in any extremity Psychiatric: Negative for suicidal ideation/homicidal ideation   ____________________________________________   PHYSICAL EXAM:  VITAL SIGNS: ED Triage Vitals [03/10/21 1059]  Enc Vitals Group     BP 110/61     Pulse Rate 69     Resp 18     Temp 99 F (37.2 C)     Temp Source Oral     SpO2 94 %     Weight 237 lb (107.5 kg)     Height 5\' 3"  (1.6 m)     Head Circumference      Peak Flow      Pain Score 8      Pain Loc      Pain Edu?      Excl. in GC?    Constitutional: Alert and oriented. Well appearing elderly morbidly obese Caucasian female in no acute distress. Eyes: Conjunctivae are normal. PERRL. Head: Atraumatic. Nose: No congestion/rhinnorhea. Mouth/Throat: Mucous membranes are moist. Neck: No stridor Cardiovascular: Grossly normal heart sounds.  Good peripheral circulation. Respiratory: Normal respiratory effort.  No retractions. Gastrointestinal: Soft and mild tenderness to palpation in the right lower quadrant. No distention. Musculoskeletal: No obvious deformities Neurologic:  Normal speech and language. No gross focal neurologic deficits are appreciated. Skin:  Skin is warm and dry. No rash noted. Psychiatric: Mood and affect are normal. Speech and behavior are normal.  ____________________________________________   LABS (all labs ordered are listed, but only abnormal results are displayed)  Labs Reviewed  LIPASE, BLOOD - Abnormal; Notable for the following components:      Result Value   Lipase 520 (*)    All other components within normal limits  COMPREHENSIVE METABOLIC PANEL - Abnormal; Notable for the following components:   Glucose, Bld 110 (*)    Creatinine, Ser 1.22 (*)    GFR, Estimated 50 (*)    All other components within normal limits  CBC - Abnormal; Notable for the following components:   WBC 12.0 (*)    All other components within normal limits  URINALYSIS, COMPLETE (UACMP) WITH MICROSCOPIC - Abnormal; Notable for the following components:   Color, Urine YELLOW (*)    APPearance CLEAR (*)    All other components within normal limits   ____________________________________________  EKG  ED ECG REPORT I, , the attending physician, personally viewed and interpreted this ECG.  Date: 03/10/2021 EKG Time: 1104 Rate: 72 Rhythm: normal sinus rhythm QRS Axis: normal Intervals: normal ST/T Wave abnormalities: normal Narrative  Interpretation: no evidence of acute ischemia  ____________________________________________  RADIOLOGY  ED MD interpretation: CT of the abdomen and pelvis with IV contrast shows no evidence for patient's acute symptoms however does show some incidental findings of a 2 mm nonobstructive intrarenal kidney stone as well as atherosclerosis in the aorta and anterior wedging of T11  Official radiology report(s): CT Abdomen Pelvis W Contrast  Result Date: 03/10/2021 CLINICAL DATA:  Epigastric pain. EXAM: CT ABDOMEN AND PELVIS WITH CONTRAST TECHNIQUE: Multidetector CT imaging of the abdomen and pelvis was performed using the standard protocol following bolus administration of intravenous contrast. CONTRAST:  03/12/2021 OMNIPAQUE IOHEXOL 350 MG/ML SOLN COMPARISON:  None. FINDINGS: Lower chest: 2 calcified granulomata are seen in the lung bases. Hepatobiliary: No focal liver abnormality is seen. No gallstones, gallbladder wall thickening, or biliary dilatation. Pancreas: Unremarkable. No pancreatic ductal dilatation or surrounding inflammatory changes. Spleen: Normal in size without focal abnormality. Adrenals/Urinary Tract: The adrenal glands  are normal. A 2 mm stone is seen in the upper pole the left kidney on coronal image 69. No other renal stones. No hydronephrosis. The ureters are normal. Evaluation of the bladder is limited due to streak artifact off hip replacements. However, the bladder is grossly unremarkable. Stomach/Bowel: The stomach, small bowel, and colon are unremarkable. The appendix is poorly seen but there is no evidence of appendicitis. Vascular/Lymphatic: Calcified atherosclerosis is seen in the nonaneurysmal aorta, iliac vessels, and femoral vessels. No adenopathy. Reproductive: Uterus and bilateral adnexa are unremarkable. Other: No abdominal wall hernia or abnormality. No abdominopelvic ascites. Musculoskeletal: Bilateral hip replacements. Degenerative changes in the spine. Grade 1  anterolisthesis L4 versus L5. Anterior wedging of T11, age indeterminate. No other abnormalities. IMPRESSION: 1. No cause for the patient's symptoms identified. 2. A 2 mm nonobstructive stone is seen in the left kidney. No renal obstruction. No ureteral stones. 3. Calcified atherosclerosis in the nonaneurysmal aorta, iliac vessels, and femoral vessels. 4. Anterior wedging of T11, age indeterminate but could be nonacute. Recommend clinical correlation. Degenerative changes in the thoracic and lumbar spine. Electronically Signed   By: Gerome Sam III M.D.   On: 03/10/2021 13:59    ____________________________________________   PROCEDURES  Procedure(s) performed (including Critical Care):  .1-3 Lead EKG Interpretation  Date/Time: 03/10/2021 4:06 PM Performed by: Merwyn Katos, MD Authorized by: Merwyn Katos, MD     Interpretation: normal     ECG rate:  87   ECG rate assessment: normal     Rhythm: sinus rhythm     Ectopy: none     Conduction: normal     ____________________________________________   INITIAL IMPRESSION / ASSESSMENT AND PLAN / ED COURSE  As part of my medical decision making, I reviewed the following data within the electronic medical record, if available:  Nursing notes reviewed and incorporated, Labs reviewed, EKG interpreted, Old chart reviewed, Radiograph reviewed and Notes from prior ED visits reviewed and incorporated     Patients symptoms not typical for emergent causes of abdominal pain such as, but not limited to, appendicitis, abdominal aortic aneurysm, surgical biliary disease, pancreatitis, SBO, mesenteric ischemia, serious intra-abdominal bacterial illness. Presentation also not typical of gynecologic emergencies such as TOA, Ovarian Torsion, PID. Not Ectopic. Doubt atypical ACS. CT of the abdomen and pelvis with contrast shows no evidence of acute abnormalities and specifically no evidence or reason for patient's lipase to be elevated Consult:  GI-spoke with Dr. Norma Fredrickson regarding this patient's case and any other imaging or work-up that would be necessary here to rule out all emergent causes.  He did not have any other recommendations emergently however does recommend following up with her GI specialist who performed her colonoscopy within the next 1-2 weeks for further evaluation and management Pt tolerating PO. Disposition: Patient will be discharged with strict return precautions and follow up with primary MD within 12-24 hours for further evaluation. Patient understands that this still may have an early presentation of an emergent medical condition such as appendicitis that will require a recheck.     ____________________________________________   FINAL CLINICAL IMPRESSION(S) / ED DIAGNOSES  Final diagnoses:  Generalized abdominal pain  Elevated lipase     ED Discharge Orders     None        Note:  This document was prepared using Dragon voice recognition software and may include unintentional dictation errors.    Merwyn Katos, MD 03/10/21 [phone]

## 2021-05-20 ENCOUNTER — Other Ambulatory Visit: Payer: Self-pay | Admitting: Primary Care

## 2021-05-20 DIAGNOSIS — Z1231 Encounter for screening mammogram for malignant neoplasm of breast: Secondary | ICD-10-CM

## 2021-06-06 ENCOUNTER — Emergency Department: Payer: Medicaid Other

## 2021-06-06 ENCOUNTER — Emergency Department
Admission: EM | Admit: 2021-06-06 | Discharge: 2021-06-06 | Disposition: A | Discharge status: Home / Self Care | Payer: Medicaid Other | Attending: Emergency Medicine | Admitting: Emergency Medicine

## 2021-06-06 ENCOUNTER — Encounter: Payer: Self-pay | Admitting: Emergency Medicine

## 2021-06-06 ENCOUNTER — Other Ambulatory Visit: Payer: Self-pay

## 2021-06-06 DIAGNOSIS — Z96643 Presence of artificial hip joint, bilateral: Secondary | ICD-10-CM | POA: Diagnosis not present

## 2021-06-06 DIAGNOSIS — R1013 Epigastric pain: Secondary | ICD-10-CM | POA: Diagnosis present

## 2021-06-06 DIAGNOSIS — Z7982 Long term (current) use of aspirin: Secondary | ICD-10-CM | POA: Insufficient documentation

## 2021-06-06 DIAGNOSIS — K8020 Calculus of gallbladder without cholecystitis without obstruction: Secondary | ICD-10-CM | POA: Insufficient documentation

## 2021-06-06 DIAGNOSIS — Z87891 Personal history of nicotine dependence: Secondary | ICD-10-CM | POA: Insufficient documentation

## 2021-06-06 DIAGNOSIS — K8050 Calculus of bile duct without cholangitis or cholecystitis without obstruction: Secondary | ICD-10-CM

## 2021-06-06 DIAGNOSIS — R109 Unspecified abdominal pain: Secondary | ICD-10-CM

## 2021-06-06 LAB — CBC WITH DIFFERENTIAL/PLATELET
Abs Immature Granulocytes: 0.06 10*3/uL (ref 0.00–0.07)
Basophils Absolute: 0 10*3/uL (ref 0.0–0.1)
Basophils Relative: 0 %
Eosinophils Absolute: 0.1 10*3/uL (ref 0.0–0.5)
Eosinophils Relative: 1 %
HCT: 44.7 % (ref 36.0–46.0)
Hemoglobin: 14.7 g/dL (ref 12.0–15.0)
Immature Granulocytes: 1 %
Lymphocytes Relative: 17 %
Lymphs Abs: 1.9 10*3/uL (ref 0.7–4.0)
MCH: 30.1 pg (ref 26.0–34.0)
MCHC: 32.9 g/dL (ref 30.0–36.0)
MCV: 91.6 fL (ref 80.0–100.0)
Monocytes Absolute: 0.5 10*3/uL (ref 0.1–1.0)
Monocytes Relative: 5 %
Neutro Abs: 8.5 10*3/uL — ABNORMAL HIGH (ref 1.7–7.7)
Neutrophils Relative %: 76 %
Platelets: 275 10*3/uL (ref 150–400)
RBC: 4.88 MIL/uL (ref 3.87–5.11)
RDW: 12.4 % (ref 11.5–15.5)
WBC: 11.1 10*3/uL — ABNORMAL HIGH (ref 4.0–10.5)
nRBC: 0 % (ref 0.0–0.2)

## 2021-06-06 LAB — LIPASE, BLOOD: Lipase: 47 U/L (ref 11–51)

## 2021-06-06 LAB — COMPREHENSIVE METABOLIC PANEL
ALT: 35 U/L (ref 0–44)
AST: 44 U/L — ABNORMAL HIGH (ref 15–41)
Albumin: 4.1 g/dL (ref 3.5–5.0)
Alkaline Phosphatase: 97 U/L (ref 38–126)
Anion gap: 7 (ref 5–15)
BUN: 18 mg/dL (ref 8–23)
CO2: 26 mmol/L (ref 22–32)
Calcium: 8.9 mg/dL (ref 8.9–10.3)
Chloride: 102 mmol/L (ref 98–111)
Creatinine, Ser: 0.9 mg/dL (ref 0.44–1.00)
GFR, Estimated: >60 mL/min (ref >60)
Glucose, Bld: 113 mg/dL — ABNORMAL HIGH (ref 70–99)
Potassium: 4 mmol/L (ref 3.5–5.1)
Sodium: 135 mmol/L (ref 135–145)
Total Bilirubin: 0.9 mg/dL (ref 0.3–1.2)
Total Protein: 7.5 g/dL (ref 6.5–8.1)

## 2021-06-06 LAB — URINALYSIS, COMPLETE (UACMP) WITH MICROSCOPIC
Bacteria, UA: NONE SEEN
Bilirubin Urine: NEGATIVE
Glucose, UA: NEGATIVE mg/dL
Hgb urine dipstick: NEGATIVE
Ketones, ur: 5 mg/dL — AB
Leukocytes,Ua: NEGATIVE
Nitrite: NEGATIVE
Protein, ur: NEGATIVE mg/dL
Specific Gravity, Urine: 1.013 (ref 1.005–1.030)
pH: 5 (ref 5.0–8.0)

## 2021-06-06 LAB — TROPONIN I (HIGH SENSITIVITY): Troponin I (High Sensitivity): 6 ng/L (ref <18)

## 2021-06-06 MED ORDER — HYDROMORPHONE HCL 1 MG/ML IJ SOLN
1.0000 mg | Freq: Once | INTRAMUSCULAR | Status: AC
  Administered 2021-06-06: 1 mg via INTRAMUSCULAR
  Filled 2021-06-06: qty 1

## 2021-06-06 MED ORDER — ONDANSETRON 4 MG PO TBDP
4.0000 mg | ORAL_TABLET | Freq: Once | ORAL | Status: AC
  Administered 2021-06-06: 4 mg via ORAL
  Filled 2021-06-06: qty 1

## 2021-06-06 MED ORDER — ONDANSETRON HCL 4 MG PO TABS: 4.0000 mg | ORAL_TABLET | Freq: Every day | ORAL | 0 refills | Status: DC | PRN

## 2021-06-06 MED ORDER — HYDROCODONE-ACETAMINOPHEN 5-325 MG PO TABS: 1.0000 | ORAL_TABLET | ORAL | 0 refills | Status: DC | PRN

## 2021-06-06 NOTE — ED Triage Notes (Signed)
Pt comes into the ED via POV c/o right side chest pain and epigastric pain that started this morning at 2:00 after having a BM.  Pt admits she took some gas pills and ibuprofen at home that are just now starting to take effect.  PT denies any cardiac history.  Pt denies any radiation of pain, dizziness, nausea, or SHOB.  Pt ambulatory to triage with even and unlabored respirations and in NAD.

## 2021-06-06 NOTE — ED Notes (Signed)
Rainbow sent to the lab.  

## 2021-06-06 NOTE — ED Provider Notes (Signed)
[facility] Emergency Department Provider Note  Time seen: 9:46 AM  I have reviewed the triage vital signs and the nursing notes.   HISTORY  Chief Complaint Chest Pain and Abdominal Pain   HPI Chloe Farley is a 60 y.o. female with a past medical history of hyperlipidemia, obesity, presents to the emergency department for upper abdominal pain.  According to the patient since around 130 this morning she has been experiencing moderate to significant upper abdominal pain in the epigastrium.  Patient states it feels similar to 6 weeks ago when she was diagnosed with pancreatitis based off an elevated lipase.  Patient states she has not eaten or drinking much of anything over the last couple days.  Denies any alcohol use.  Denies any fever.  States nausea but denies any vomiting.  No diarrhea.  No chest pain or shortness of breath.   Past Medical History:  Diagnosis Date   Hyperlipidemia    Osteoarthritis of both knees    Vitamin D deficiency     Patient Active Problem List   Diagnosis Date Noted   Leg cramp 01/11/2020   Lightheadedness 08/17/2019   Atypical chest pain 08/17/2019   Bilateral carotid artery stenosis 08/17/2019   Hyperlipidemia 08/17/2019   Morbid obesity (HCC) 08/17/2019   Osteoarthritis of knee 11/24/2017    Past Surgical History:  Procedure Laterality Date   TOTAL HIP ARTHROPLASTY     right & left    TUBAL LIGATION      Prior to Admission medications   Medication Sig Start Date End Date Taking? Authorizing Provider  aspirin EC 81 MG tablet Take 1 tablet (81 mg total) by mouth daily. 08/17/19   End, Cristal Deer, MD  B Complex-C (B-COMPLEX WITH VITAMIN C) tablet Take 1 tablet by mouth daily. For leg cramps    [provider]  diphenhydrAMINE (BENADRYL) 25 MG tablet Take 12.5 mg by mouth at bedtime as needed (leg cramps).    [provider]  Flaxseed, Linseed, (FLAXSEED OIL) 1200 MG CAPS Take 1,200 mg by mouth daily.     [provider]  loratadine (CLARITIN) 10 MG tablet Take 10 mg by mouth daily.    [provider]  TURMERIC PO Take 2 capsules by mouth daily.     [provider]    Allergies  Allergen Reactions   Other Anaphylaxis and Nausea Only   Rofecoxib Other (See Comments)   Sulfa Antibiotics Anaphylaxis, Anxiety, Hives, Hypertension, Itching, Palpitations, Rash, Shortness Of Breath and Swelling   Depakote [Divalproex Sodium]    Meloxicam     Family History  Problem Relation Age of Onset   Cancer Mother    Hyperlipidemia Father    Heart attack Father    Hypertension Sister    Hyperlipidemia Sister    Head & neck cancer Brother     Social History Social History   Tobacco Use   Smoking status: Former    Packs/day: 1.00    Years: 15.00    Pack years: 15.00    Types: Cigarettes    Quit date: 04/13/2020    Years since quitting: 1.1   Smokeless tobacco: Never  Vaping Use   Vaping Use: Never used  Substance Use Topics   Alcohol use: Never    Review of Systems Constitutional: Negative for fever. Cardiovascular: Negative for chest pain. Respiratory: Negative for shortness of breath. Gastrointestinal: Epigastric pain/upper abdominal pain.  More so on the left upper quadrant.  Moderate.  Positive for nausea.  Negative for vomiting or diarrhea Genitourinary: Negative for urinary compaints Musculoskeletal: Negative for musculoskeletal complaints Neurological: Negative for headache All other ROS negative  ____________________________________________   PHYSICAL EXAM:  VITAL SIGNS: ED Triage Vitals  Enc Vitals Group     BP 06/06/21 0436 121/63     Pulse Rate 06/06/21 0436 74     Resp 06/06/21 0436 18     Temp 06/06/21 0436 97.6 F (36.4 C)     Temp Source 06/06/21 0436 Oral     SpO2 06/06/21 0436 93 %     Weight 06/06/21 0440 236 lb 15.9 oz (107.5 kg)     Height 06/06/21 0440 5\' 3"  (1.6 m)     Head Circumference --      Peak Flow --      Pain  Score 06/06/21 0439 6     Pain Loc --      Pain Edu? --      Excl. in GC? --    Constitutional: Alert and oriented. Well appearing and in no distress. Eyes: Normal exam ENT      Head: Normocephalic and atraumatic.      Mouth/Throat: Mucous membranes are moist. Cardiovascular: Normal rate, regular rhythm. Respiratory: Normal respiratory effort without tachypnea nor retractions. Breath sounds are clear Gastrointestinal: Soft, moderate upper abdominal pain worse on the left upper quadrant epigastrium.  However mild tenderness across the abdomen.  No rebound guarding or distention. Musculoskeletal: Nontender with normal range of motion in all extremities.  Neurologic:  Normal speech and language. No gross focal neurologic deficits  Skin:  Skin is warm, dry and intact.  Psychiatric: Mood and affect are normal.   ____________________________________________    EKG  EKG viewed and interpreted by myself shows a normal sinus rhythm at 71 bpm with a narrow QRS, normal axis, normal intervals, no concerning ST changes.  ____________________________________________    RADIOLOGY  Chest x-ray is negative. Ultrasound shows gallstones without signs of acute cholecystitis.  ____________________________________________   INITIAL IMPRESSION / ASSESSMENT AND PLAN / ED COURSE  Pertinent labs & imaging results that were available during my care of the patient were reviewed by me and considered in my medical decision making (see chart for details).   Patient presents to the emergency department for upper abdominal pain, states it feels similar to 6 weeks ago when she was diagnosed with pancreatitis.  Patient denies any alcohol use.  Lab work is largely nonrevealing including a normal troponin, normal lipase reassuring LFTs and WBC.  Patient does have upper abdominal tenderness.  Ultrasound shows gallstones but no signs of acute cholecystitis.  We will proceed with a CT scan to further evaluate.  We  will treat pain and nausea while awaiting CT results.  Differential would include gastritis, biliary colic, less likely pancreatitis, although I suspect 6 weeks ago she likely experienced gallstone pancreatitis.  We will reassess after pain management I believe ultimately patient needs to follow-up with a general surgeon to have her gallbladder removed.  CT scan shows signs of hydronephrosis however urinalysis is clean with no red cells white cells or bacteria.  We will send a urine culture as a precaution.  Patient states her pain is now gone after pain medication.  I highly suspect given the location of the pain recent pancreatitis and now return of upper abdominal pain that the patient is experiencing biliary colic.  We will refer to general surgery for further evaluation and consideration for cholecystectomy.  We will discharge with a short course  of pain medication.  I did discuss return precautions for any return of/worsening abdominal pain or any fever.  Mariem Skolnick was evaluated in Emergency Department on 06/06/2021 for the symptoms described in the history of present illness. She was evaluated in the context of the global COVID-19 pandemic, which necessitated consideration that the patient might be at risk for infection with the SARS-CoV-2 virus that causes COVID-19. Institutional protocols and algorithms that pertain to the evaluation of patients at risk for COVID-19 are in a state of rapid change based on information released by regulatory bodies including the CDC and federal and state organizations. These policies and algorithms were followed during the patient's care in the ED.  ____________________________________________   FINAL CLINICAL IMPRESSION(S) / ED DIAGNOSES  Upper abdominal pain Biliary colic   Minna Antis, MD 06/06/21 1101

## 2021-06-06 NOTE — ED Notes (Signed)
Patient transported to CT 

## 2021-06-06 NOTE — Discharge Instructions (Signed)
Please call the number provided for general surgery to arrange a follow-up appointment as soon as possible.  As we discussed please adhere to a low/no fat diet for the next 2 weeks and drink plenty of fluids.  Please take your pain medication as needed but only as prescribed.  Return to the emergency department for any return of significant abdominal pain, any fever, or any other symptom personally concerning to yourself.

## 2021-06-06 NOTE — ED Provider Notes (Signed)
Emergency Medicine Provider Triage Evaluation Note  Chloe Farley , a 60 y.o. female  was evaluated in triage.  Pt complains of chest, epigastric pain.  Review of Systems  Positive: Epigastric pain, chest pain Negative: Nausea or vomiting  Physical Exam  BP 121/63 (BP Location: Left Arm)   Pulse 74   Temp 97.6 F (36.4 C) (Oral)   Resp 18   Ht 5\' 3"  (1.6 m)   Wt 107.5 kg   SpO2 93%   BMI 41.98 kg/m  Gen:   Awake, mild distress   Resp:  Normal effort  MSK:   Moves extremities without difficulty  Other:  Epigastric tenderness to palpation  Medical Decision Making  Medically screening exam initiated at 4:44 AM.  Appropriate orders placed.  Chloe Farley was informed that the remainder of the evaluation will be completed by another provider, this initial triage assessment does not replace that evaluation, and the importance of remaining in the ED until their evaluation is complete.  60 year old female presenting with epigastric and chest pain.  Will obtain cardiac panel to include LFTs/lipase, chest x-ray, right upper quadrant abdominal ultrasound while patient is awaiting treatment room.   66, MD 06/06/21 [phone]

## 2021-06-07 LAB — URINE CULTURE: Culture: <10000 — AB

## 2021-06-11 ENCOUNTER — Ambulatory Visit: Payer: Self-pay | Admitting: General Surgery

## 2021-06-11 NOTE — H&P (View-Only) (Signed)
PATIENT PROFILE: Chloe Farley is a 60 y.o. female who presents to the Clinic for consultation at the request of Dr. Kerman Farley for evaluation of cholelithiasis.  PCP:  Chloe Brunette, NP  HISTORY OF PRESENT ILLNESS: Chloe Farley reports she has been having episodes of biliary colic's for the last few months.  She endorses that the last 2 months the pain is so severe that she has to go to the ED.  The first time she was diagnosed with gallstone pancreatitis but the pain resolved and she was sent home.  The second time was a week ago.  The pain localized to the right upper quadrant.  Pain does not radiate to other part of the body.  Pain aggravated by eating.  Alleviating factor is pain medication in the ED.  She denies any fever or chills.  Last lipase level was normal.  Ultrasound of the abdomen shows cholelithiasis without sign of cholecystitis.  I personally evaluated the images of the ultrasound.   PROBLEM LIST: Problem List  Never Reviewed          Noted   Leg cramp 01/11/2020   Bilateral carotid artery stenosis 08/17/2019   Hyperlipidemia 08/17/2019   Morbid obesity (CMS-HCC) 08/17/2019   Atypical chest pain 08/17/2019   Lightheadedness 08/17/2019   Osteoarthritis of knee 11/24/2017    GENERAL REVIEW OF SYSTEMS:   General ROS: negative for - chills, fatigue, fever, weight gain or weight loss Allergy and Immunology ROS: negative for - hives  Hematological and Lymphatic ROS: negative for - bleeding problems or bruising, negative for palpable nodes Endocrine ROS: negative for - heat or cold intolerance, hair changes Respiratory ROS: negative for - cough, shortness of breath or wheezing Cardiovascular ROS: no chest pain or palpitations GI ROS: Positive for nausea, vomiting, abdominal pain, diarrhea, constipation Musculoskeletal ROS: negative for - joint swelling or muscle pain Neurological ROS: negative for - confusion, syncope Dermatological ROS: negative for pruritus and rash Psychiatric:  negative for anxiety, depression, difficulty sleeping and memory loss  MEDICATIONS: Current Outpatient Medications  Medication Sig Dispense Refill   chlorhexidine (PERIDEX) 0.12 % solution USE ONE-HALF CAPFUL TWICE DAILY IN THE MORNING AND EVENING FOR 30 SECONDS AFTER BRUSHING TEETH. DO NOT RINSE WITH WATER, OTHER MOUTHWASHES, OR EAT IMMEDIATELY AFTER USE     varenicline (CHANTIX) 1 mg tablet      diphenhydrAMINE (BENADRYL) 25 mg capsule Take 25 mg by mouth nightly (Patient not taking: Reported on 04/04/2021)     peg-electrolyte (NULYTELY) solution Take 4,000 mLs by mouth as directed Take as directed for colon prep. (Patient not taking: Reported on 04/04/2021) 4000 mL 0   TURMERIC ORAL Take 2 capsules by mouth once daily (Patient not taking: Reported on 04/04/2021)     No current facility-administered medications for this visit.    ALLERGIES: Depakote [divalproex], Meloxicam, Sulfa (sulfonamide antibiotics), and Vioxx [rofecoxib]  PAST MEDICAL HISTORY: No past medical history on file.  PAST SURGICAL HISTORY: Past Surgical History:  Procedure Laterality Date   COLONOSCOPY  10/11/2014   2 Sessile Serrated Adenomas, 1 Hyperplastic/Dr. Earlie Server Mahkri at [facility] in [place], [place]   COLONOSCOPY N/A 01/24/2021   Procedure: COLORECTAL CANCER SCREENING; COLONOSCOPY ON INDIVIDUAL AT HIGH RISK;  Surgeon: Dennison Mascot, MD;  Location: [facility];  Service: Gastroenterology;  Laterality: N/A;   JOINT REPLACEMENT       FAMILY HISTORY: No family history on file.   SOCIAL HISTORY: Social History   Socioeconomic History   Marital status: Widowed  Tobacco Use   Smoking status: Every Day    Packs/day: 0.50    Types: Cigarettes   Smokeless tobacco: Never  Vaping Use   Vaping Use: Never used  Substance and Sexual Activity   Alcohol use: Not Currently   Drug use: Never    PHYSICAL EXAM: Vitals:   06/11/21 1548  BP: 122/73  Pulse: 92   Body mass index is  42.8 kg/m. Weight: (!) 106.1 kg (234 lb)   GENERAL: Alert, active, oriented x3  HEENT: Pupils equal reactive to light. Extraocular movements are intact. Sclera clear. Palpebral conjunctiva normal red color.Pharynx clear.  NECK: Supple with no palpable mass and no adenopathy.  LUNGS: Sound clear with no rales rhonchi or wheezes.  HEART: Regular rhythm S1 and S2 without murmur.  ABDOMEN: Soft and depressible, nontender with no palpable mass, no hepatomegaly.   EXTREMITIES: Well-developed well-nourished symmetrical with no dependent edema.  NEUROLOGICAL: Awake alert oriented, facial expression symmetrical, moving all extremities.  REVIEW OF DATA: I have reviewed the following data today: Initial consult on 04/04/2021  Component Date Value   Lipase 04/04/2021 35    Glucose 04/04/2021 96    Sodium 04/04/2021 139    Potassium 04/04/2021 4.3    Chloride 04/04/2021 106    Carbon Dioxide (CO2) 04/04/2021 27.1    Urea Nitrogen (BUN) 04/04/2021 16    Creatinine 04/04/2021 0.9    Glomerular Filtration Ra* 04/04/2021 63    Calcium 04/04/2021 9.0    AST  04/04/2021 15    ALT  04/04/2021 12    Alk Phos (alkaline Phosp* 04/04/2021 82    Albumin 04/04/2021 4.0    Bilirubin, Total 04/04/2021 0.5    Protein, Total 04/04/2021 6.6    A/G Ratio 04/04/2021 1.5      ASSESSMENT: Chloe Farley is a 60 y.o. female presenting for consultation for cholelithiasis.    Patient was oriented about the diagnosis of cholelithiasis. Also oriented about what is the gallbladder, its anatomy and function and the implications of having stones. The patient was oriented about the treatment alternatives (observation vs cholecystectomy). Patient was oriented that a low percentage of patient will continue to have similar pain symptoms even after the gallbladder is removed. Surgical technique (open vs laparoscopic) was discussed. It was also discussed the goals of the surgery (decrease the pain episodes and avoid the  risk of cholecystitis) and the risk of surgery including: bleeding, infection, common bile duct injury, stone retention, injury to other organs such as bowel, liver, stomach, other complications such as hernia, bowel obstruction among others. Also discussed with patient about anesthesia and its complications such as: reaction to medications, pneumonia, heart complications, death, among others.   The fact that this patient has had 2 severe episodes in the last couple of months, the last episode a week ago and she has been needing to go to the emergency room I think that this should be done as an already matter.  I will try to book her case for this week to avoid any new episode and the fact that she will need to return to the ED.  If the surgery is done on an elective basis as soon as possible it would be of less risk for the patient.  We will try to approve from her insurance company soon as possible.  Patient understand and agreed to proceed.  Cholelithiasis without cholecystitis [K80.20]  PLAN: 1.  Robotic assisted laparoscopic cholecystectomy (78676) 2.  CBC, CMP (done) 3.  Do not take  aspirin 5 days before the procedure 4.  Contact us if has any question or concern.   Patient verbalized understanding, all questions were answered, and were agreeable with the plan outlined above.     Herbert Pun, MD  Electronically signed by Herbert Pun, MD

## 2021-06-11 NOTE — H&P (Signed)
PATIENT PROFILE: Chloe Farley is a 60 y.o. female who presents to the Clinic for consultation at the request of Dr. Paduchowski for evaluation of cholelithiasis.  PCP:  Rubio, Jessica Anna, NP  HISTORY OF PRESENT ILLNESS: Chloe Farley reports she has been having episodes of biliary colic's for the last few months.  She endorses that the last 2 months the pain is so severe that she has to go to the ED.  The first time she was diagnosed with gallstone pancreatitis but the pain resolved and she was sent home.  The second time was a week ago.  The pain localized to the right upper quadrant.  Pain does not radiate to other part of the body.  Pain aggravated by eating.  Alleviating factor is pain medication in the ED.  She denies any fever or chills.  Last lipase level was normal.  Ultrasound of the abdomen shows cholelithiasis without sign of cholecystitis.  I personally evaluated the images of the ultrasound.   PROBLEM LIST: Problem List  Never Reviewed          Noted   Leg cramp 01/11/2020   Bilateral carotid artery stenosis 08/17/2019   Hyperlipidemia 08/17/2019   Morbid obesity (CMS-HCC) 08/17/2019   Atypical chest pain 08/17/2019   Lightheadedness 08/17/2019   Osteoarthritis of knee 11/24/2017    GENERAL REVIEW OF SYSTEMS:   General ROS: negative for - chills, fatigue, fever, weight gain or weight loss Allergy and Immunology ROS: negative for - hives  Hematological and Lymphatic ROS: negative for - bleeding problems or bruising, negative for palpable nodes Endocrine ROS: negative for - heat or cold intolerance, hair changes Respiratory ROS: negative for - cough, shortness of breath or wheezing Cardiovascular ROS: no chest pain or palpitations GI ROS: Positive for nausea, vomiting, abdominal pain, diarrhea, constipation Musculoskeletal ROS: negative for - joint swelling or muscle pain Neurological ROS: negative for - confusion, syncope Dermatological ROS: negative for pruritus and rash Psychiatric:  negative for anxiety, depression, difficulty sleeping and memory loss  MEDICATIONS: Current Outpatient Medications  Medication Sig Dispense Refill   chlorhexidine (PERIDEX) 0.12 % solution USE ONE-HALF CAPFUL TWICE DAILY IN THE MORNING AND EVENING FOR 30 SECONDS AFTER BRUSHING TEETH. DO NOT RINSE WITH WATER, OTHER MOUTHWASHES, OR EAT IMMEDIATELY AFTER USE     varenicline (CHANTIX) 1 mg tablet      diphenhydrAMINE (BENADRYL) 25 mg capsule Take 25 mg by mouth nightly (Patient not taking: Reported on 04/04/2021)     peg-electrolyte (NULYTELY) solution Take 4,000 mLs by mouth as directed Take as directed for colon prep. (Patient not taking: Reported on 04/04/2021) 4000 mL 0   TURMERIC ORAL Take 2 capsules by mouth once daily (Patient not taking: Reported on 04/04/2021)     No current facility-administered medications for this visit.    ALLERGIES: Depakote [divalproex], Meloxicam, Sulfa (sulfonamide antibiotics), and Vioxx [rofecoxib]  PAST MEDICAL HISTORY: No past medical history on file.  PAST SURGICAL HISTORY: Past Surgical History:  Procedure Laterality Date   COLONOSCOPY  10/11/2014   2 Sessile Serrated Adenomas, 1 Hyperplastic/Dr. Mohammed Mahkri at [facility] in [place], [place]   COLONOSCOPY N/A 01/24/2021   Procedure: COLORECTAL CANCER SCREENING; COLONOSCOPY ON INDIVIDUAL AT HIGH RISK;  Surgeon: Veerappan, Annapoorani, MD;  Location: [facility];  Service: Gastroenterology;  Laterality: N/A;   JOINT REPLACEMENT       FAMILY HISTORY: No family history on file.   SOCIAL HISTORY: Social History   Socioeconomic History   Marital status: Widowed    Tobacco Use   Smoking status: Every Day    Packs/day: 0.50    Types: Cigarettes   Smokeless tobacco: Never  Vaping Use   Vaping Use: Never used  Substance and Sexual Activity   Alcohol use: Not Currently   Drug use: Never    PHYSICAL EXAM: Vitals:   06/11/21 1548  BP: 122/73  Pulse: 92   Body mass index is  42.8 kg/m. Weight: (!) 106.1 kg (234 lb)   GENERAL: Alert, active, oriented x3  HEENT: Pupils equal reactive to light. Extraocular movements are intact. Sclera clear. Palpebral conjunctiva normal red color.Pharynx clear.  NECK: Supple with no palpable mass and no adenopathy.  LUNGS: Sound clear with no rales rhonchi or wheezes.  HEART: Regular rhythm S1 and S2 without murmur.  ABDOMEN: Soft and depressible, nontender with no palpable mass, no hepatomegaly.   EXTREMITIES: Well-developed well-nourished symmetrical with no dependent edema.  NEUROLOGICAL: Awake alert oriented, facial expression symmetrical, moving all extremities.  REVIEW OF DATA: I have reviewed the following data today: Initial consult on 04/04/2021  Component Date Value   Lipase 04/04/2021 35    Glucose 04/04/2021 96    Sodium 04/04/2021 139    Potassium 04/04/2021 4.3    Chloride 04/04/2021 106    Carbon Dioxide (CO2) 04/04/2021 27.1    Urea Nitrogen (BUN) 04/04/2021 16    Creatinine 04/04/2021 0.9    Glomerular Filtration Ra* 04/04/2021 63    Calcium 04/04/2021 9.0    AST  04/04/2021 15    ALT  04/04/2021 12    Alk Phos (alkaline Phosp* 04/04/2021 82    Albumin 04/04/2021 4.0    Bilirubin, Total 04/04/2021 0.5    Protein, Total 04/04/2021 6.6    A/G Ratio 04/04/2021 1.5      ASSESSMENT: Chloe Farley is a 60 y.o. female presenting for consultation for cholelithiasis.    Patient was oriented about the diagnosis of cholelithiasis. Also oriented about what is the gallbladder, its anatomy and function and the implications of having stones. The patient was oriented about the treatment alternatives (observation vs cholecystectomy). Patient was oriented that a low percentage of patient will continue to have similar pain symptoms even after the gallbladder is removed. Surgical technique (open vs laparoscopic) was discussed. It was also discussed the goals of the surgery (decrease the pain episodes and avoid the  risk of cholecystitis) and the risk of surgery including: bleeding, infection, common bile duct injury, stone retention, injury to other organs such as bowel, liver, stomach, other complications such as hernia, bowel obstruction among others. Also discussed with patient about anesthesia and its complications such as: reaction to medications, pneumonia, heart complications, death, among others.   The fact that this patient has had 2 severe episodes in the last couple of months, the last episode a week ago and she has been needing to go to the emergency room I think that this should be done as an already matter.  I will try to book her case for this week to avoid any new episode and the fact that she will need to return to the ED.  If the surgery is done on an elective basis as soon as possible it would be of less risk for the patient.  We will try to approve from her insurance company soon as possible.  Patient understand and agreed to proceed.  Cholelithiasis without cholecystitis [K80.20]  PLAN: 1.  Robotic assisted laparoscopic cholecystectomy (47562) 2.  CBC, CMP (done) 3.  Do not take   aspirin 5 days before the procedure 4.  Contact us if has any question or concern.   Patient verbalized understanding, all questions were answered, and were agreeable with the plan outlined above.     Angelyse Heslin Cintron-Diaz, MD  Electronically signed by Stephan Draughn Cintron-Diaz, MD  

## 2021-06-13 ENCOUNTER — Encounter
Admission: RE | Admit: 2021-06-13 | Discharge: 2021-06-13 | Disposition: A | Discharge status: Home / Self Care | Payer: Medicaid Other | Source: Ambulatory Visit | Attending: General Surgery | Admitting: General Surgery

## 2021-06-13 ENCOUNTER — Other Ambulatory Visit: Payer: Self-pay

## 2021-06-13 HISTORY — DX: Dyspnea, unspecified: R06.00

## 2021-06-13 NOTE — Patient Instructions (Signed)
Your procedure is scheduled on: 06/14/2021 Report to the Registration Desk on the 1st floor of the [facility]. To find out your arrival time, please call [phone] between 1PM - 3PM on: 06/13/2021  REMEMBER: Instructions that are not followed completely may result in serious medical risk, up to and including death; or upon the discretion of your surgeon and anesthesiologist your surgery may need to be rescheduled.  Do not eat food or drink liquid after midnight the night before surgery.  No gum chewing, lozengers or hard candies.   One week prior to surgery: Stop Anti-inflammatories (NSAIDS) such as Advil, Aleve, Ibuprofen, Motrin, Naproxen, Naprosyn and Aspirin based products such as Excedrin, Goodys Powder, BC Powder. You may however, continue to take Tylenol if needed for pain up until the day of surgery. Stop ANY OVER THE COUNTER supplements until after surgery.   No Alcohol for 24 hours before or after surgery.  No Smoking including e-cigarettes for 24 hours prior to surgery.  No chewable tobacco products for at least 6 hours prior to surgery.  No nicotine patches on the day of surgery.  Do not use any "recreational" drugs for at least a week prior to your surgery.  Please be advised that the combination of cocaine and anesthesia may have negative outcomes, up to and including death. If you test positive for cocaine, your surgery will be cancelled.  On the morning of surgery brush your teeth with toothpaste and water, you may rinse your mouth with mouthwash if you wish. Do not swallow any toothpaste or mouthwash.  Do not wear jewelry, make-up, hairpins, clips or nail polish.  Do not wear lotions, powders, or perfumes.   Do not shave body from the neck down 48 hours prior to surgery just in case you cut yourself which could leave a site for infection.   Contact lenses, hearing aids and dentures may not be worn into surgery.  Do not bring valuables to the hospital. [facility] is not responsible for any missing/lost belongings or valuables.   Notify your doctor if there is any change in your medical condition (cold, fever, infection).  Wear comfortable clothing (specific to your surgery type) to the hospital.  If you are being discharged the day of surgery, you will not be allowed to drive home. You will need a responsible adult (18 years or older) to drive you home and stay with you that night.   If you are taking public transportation, you will need to have a responsible adult (18 years or older) with you. Please confirm with your physician that it is acceptable to use public transportation.   Please call the Pre-admissions Testing Dept. at [phone] if you have any questions about these instructions.  Surgery Visitation Policy:  Patients undergoing a surgery or procedure may have one family member or support person with them as long as that person is not COVID-19 positive or experiencing its symptoms.  That person may remain in the waiting area during the procedure and may rotate out with other people.  Inpatient Visitation:    Visiting hours are 7 a.m. to 8 p.m. Up to two visitors ages 16+ are allowed at one time in a patient room. The visitors may rotate out with other people during the day. Visitors must check out when they leave, or other visitors will not be allowed. One designated support person may remain overnight. The visitor must pass COVID-19 screenings, use hand sanitizer when entering and exiting the patient's room  and wear a mask at all times, including in the patient's room. Patients must also wear a mask when staff or their visitor are in the room. Masking is required regardless of vaccination status.

## 2021-06-14 ENCOUNTER — Encounter: Payer: Self-pay | Admitting: General Surgery

## 2021-06-14 ENCOUNTER — Ambulatory Visit
Admission: RE | Admit: 2021-06-14 | Discharge: 2021-06-14 | Disposition: A | Discharge status: Home / Self Care | Payer: Medicaid Other | Attending: General Surgery | Admitting: General Surgery

## 2021-06-14 ENCOUNTER — Ambulatory Visit: Payer: Medicaid Other | Admitting: Anesthesiology

## 2021-06-14 ENCOUNTER — Encounter
Admission: RE | Disposition: A | Discharge status: Home / Self Care | Payer: Self-pay | Source: Home / Self Care | Attending: General Surgery

## 2021-06-14 DIAGNOSIS — Z6841 Body Mass Index (BMI) 40.0 and over, adult: Secondary | ICD-10-CM | POA: Insufficient documentation

## 2021-06-14 DIAGNOSIS — K8010 Calculus of gallbladder with chronic cholecystitis without obstruction: Secondary | ICD-10-CM | POA: Diagnosis present

## 2021-06-14 DIAGNOSIS — F17210 Nicotine dependence, cigarettes, uncomplicated: Secondary | ICD-10-CM | POA: Diagnosis not present

## 2021-06-14 DIAGNOSIS — K219 Gastro-esophageal reflux disease without esophagitis: Secondary | ICD-10-CM | POA: Diagnosis not present

## 2021-06-14 SURGERY — CHOLECYSTECTOMY, ROBOT-ASSISTED, LAPAROSCOPIC
Anesthesia: General | Site: Abdomen

## 2021-06-14 MED ORDER — LACTATED RINGERS IV SOLN: INTRAVENOUS | Status: DC

## 2021-06-14 MED ORDER — HYDROCODONE-ACETAMINOPHEN 5-325 MG PO TABS: 1.0000 | ORAL_TABLET | ORAL | 0 refills | Status: AC | PRN

## 2021-06-14 MED ORDER — FENTANYL CITRATE (PF) 100 MCG/2ML IJ SOLN
25.0000 ug | INTRAMUSCULAR | Status: DC | PRN
  Administered 2021-06-14: 50 ug via INTRAVENOUS

## 2021-06-14 MED ORDER — FENTANYL CITRATE (PF) 100 MCG/2ML IJ SOLN
INTRAMUSCULAR | Status: DC | PRN
  Administered 2021-06-14 (×2): 50 ug via INTRAVENOUS

## 2021-06-14 MED ORDER — IPRATROPIUM-ALBUTEROL 0.5-2.5 (3) MG/3ML IN SOLN
3.0000 mL | RESPIRATORY_TRACT | Status: AC
  Administered 2021-06-14: 3 mL via RESPIRATORY_TRACT

## 2021-06-14 MED ORDER — CEFAZOLIN SODIUM-DEXTROSE 2-4 GM/100ML-% IV SOLN
INTRAVENOUS | Status: AC
  Filled 2021-06-14: qty 100

## 2021-06-14 MED ORDER — BUPIVACAINE-EPINEPHRINE 0.25% -1:200000 IJ SOLN
INTRAMUSCULAR | Status: DC | PRN
  Administered 2021-06-14: 30 mL

## 2021-06-14 MED ORDER — CHLORHEXIDINE GLUCONATE 0.12 % MT SOLN
15.0000 mL | Freq: Once | OROMUCOSAL | Status: AC
  Administered 2021-06-14: 15 mL via OROMUCOSAL

## 2021-06-14 MED ORDER — SUCCINYLCHOLINE CHLORIDE 200 MG/10ML IV SOSY
PREFILLED_SYRINGE | INTRAVENOUS | Status: DC | PRN
  Administered 2021-06-14: 100 mg via INTRAVENOUS

## 2021-06-14 MED ORDER — INDOCYANINE GREEN 25 MG IV SOLR
1.2500 mg | Freq: Once | INTRAVENOUS | Status: AC
  Administered 2021-06-14: 1.25 mg via INTRAVENOUS
  Filled 2021-06-14: qty 10

## 2021-06-14 MED ORDER — LIDOCAINE HCL (CARDIAC) PF 100 MG/5ML IV SOSY
PREFILLED_SYRINGE | INTRAVENOUS | Status: DC | PRN
  Administered 2021-06-14: 100 mg via INTRAVENOUS

## 2021-06-14 MED ORDER — FENTANYL CITRATE (PF) 100 MCG/2ML IJ SOLN
INTRAMUSCULAR | Status: AC
  Administered 2021-06-14: 50 ug via INTRAVENOUS
  Filled 2021-06-14: qty 2

## 2021-06-14 MED ORDER — MIDAZOLAM HCL 2 MG/2ML IJ SOLN
INTRAMUSCULAR | Status: DC | PRN
  Administered 2021-06-14: 2 mg via INTRAVENOUS

## 2021-06-14 MED ORDER — CEFAZOLIN SODIUM-DEXTROSE 2-4 GM/100ML-% IV SOLN
2.0000 g | INTRAVENOUS | Status: AC
  Administered 2021-06-14: 2 g via INTRAVENOUS

## 2021-06-14 MED ORDER — ROCURONIUM BROMIDE 100 MG/10ML IV SOLN
INTRAVENOUS | Status: DC | PRN
  Administered 2021-06-14: 40 mg via INTRAVENOUS
  Administered 2021-06-14: 10 mg via INTRAVENOUS

## 2021-06-14 MED ORDER — ONDANSETRON HCL 4 MG/2ML IJ SOLN: INTRAMUSCULAR | Status: DC | PRN

## 2021-06-14 MED ORDER — CHLORHEXIDINE GLUCONATE 0.12 % MT SOLN
OROMUCOSAL | Status: AC
  Filled 2021-06-14: qty 15

## 2021-06-14 MED ORDER — ACETAMINOPHEN 10 MG/ML IV SOLN
INTRAVENOUS | Status: AC
  Filled 2021-06-14: qty 100

## 2021-06-14 MED ORDER — ORAL CARE MOUTH RINSE: 15.0000 mL | Freq: Once | OROMUCOSAL | Status: AC

## 2021-06-14 MED ORDER — IPRATROPIUM-ALBUTEROL 0.5-2.5 (3) MG/3ML IN SOLN
RESPIRATORY_TRACT | Status: AC
  Filled 2021-06-14: qty 3

## 2021-06-14 MED ORDER — EPHEDRINE SULFATE 50 MG/ML IJ SOLN
INTRAMUSCULAR | Status: DC | PRN
  Administered 2021-06-14: 10 mg via INTRAVENOUS

## 2021-06-14 MED ORDER — MIDAZOLAM HCL 2 MG/2ML IJ SOLN
INTRAMUSCULAR | Status: AC
  Filled 2021-06-14: qty 2

## 2021-06-14 MED ORDER — ACETAMINOPHEN 10 MG/ML IV SOLN
1000.0000 mg | Freq: Once | INTRAVENOUS | Status: DC | PRN
  Administered 2021-06-14: 1000 mg via INTRAVENOUS

## 2021-06-14 MED ORDER — FENTANYL CITRATE (PF) 100 MCG/2ML IJ SOLN
INTRAMUSCULAR | Status: AC
  Filled 2021-06-14: qty 2

## 2021-06-14 MED ORDER — FAMOTIDINE 20 MG PO TABS
20.0000 mg | ORAL_TABLET | Freq: Once | ORAL | Status: AC
  Administered 2021-06-14: 20 mg via ORAL

## 2021-06-14 MED ORDER — SUGAMMADEX SODIUM 200 MG/2ML IV SOLN
INTRAVENOUS | Status: DC | PRN
  Administered 2021-06-14: 200 mg via INTRAVENOUS

## 2021-06-14 MED ORDER — SCOPOLAMINE 1 MG/3DAYS TD PT72
MEDICATED_PATCH | TRANSDERMAL | Status: AC
  Filled 2021-06-14: qty 1

## 2021-06-14 MED ORDER — DEXAMETHASONE SODIUM PHOSPHATE 10 MG/ML IJ SOLN
INTRAMUSCULAR | Status: DC | PRN
  Administered 2021-06-14: 10 mg via INTRAVENOUS

## 2021-06-14 MED ORDER — OXYCODONE HCL 5 MG PO TABS
5.0000 mg | ORAL_TABLET | Freq: Once | ORAL | Status: AC | PRN
  Administered 2021-06-14: 5 mg via ORAL

## 2021-06-14 MED ORDER — OXYCODONE HCL 5 MG/5ML PO SOLN: 5.0000 mg | Freq: Once | ORAL | Status: AC | PRN

## 2021-06-14 MED ORDER — FAMOTIDINE 20 MG PO TABS
ORAL_TABLET | ORAL | Status: AC
  Filled 2021-06-14: qty 1

## 2021-06-14 MED ORDER — OXYCODONE HCL 5 MG PO TABS
ORAL_TABLET | ORAL | Status: AC
  Filled 2021-06-14: qty 1

## 2021-06-14 SURGICAL SUPPLY — 53 items
ADH SKN CLS APL DERMABOND .7 (GAUZE/BANDAGES/DRESSINGS) ×2
APL PRP STRL LF DISP 70% ISPRP (MISCELLANEOUS) ×2
BAG INFUSER PRESSURE 100CC (MISCELLANEOUS) IMPLANT
BAG SPEC RTRVL LRG 6X4 10 (ENDOMECHANICALS) ×2
BLADE SURG SZ11 CARB STEEL (BLADE) ×3 IMPLANT
CANNULA REDUC XI 12-8 STAPL (CANNULA) ×1
CANNULA REDUCER 12-8 DVNC XI (CANNULA) ×2 IMPLANT
CHLORAPREP W/TINT 26 (MISCELLANEOUS) ×3 IMPLANT
CLIP LIGATING HEMO O LOK GREEN (MISCELLANEOUS) ×3 IMPLANT
DECANTER SPIKE VIAL GLASS SM (MISCELLANEOUS) ×6 IMPLANT
DEFOGGER SCOPE WARMER CLEARIFY (MISCELLANEOUS) ×3 IMPLANT
DERMABOND ADVANCED (GAUZE/BANDAGES/DRESSINGS) ×1
DERMABOND ADVANCED .7 DNX12 (GAUZE/BANDAGES/DRESSINGS) ×2 IMPLANT
DRAPE ARM DVNC X/XI (DISPOSABLE) ×8 IMPLANT
DRAPE COLUMN DVNC XI (DISPOSABLE) ×2 IMPLANT
DRAPE DA VINCI XI ARM (DISPOSABLE) ×4
DRAPE DA VINCI XI COLUMN (DISPOSABLE) ×1
ELECT REM PT RETURN 9FT ADLT (ELECTROSURGICAL) ×3
ELECTRODE REM PT RTRN 9FT ADLT (ELECTROSURGICAL) ×2 IMPLANT
GAUZE 4X4 16PLY ~~LOC~~+RFID DBL (SPONGE) ×3 IMPLANT
GLOVE SURG ENC MOIS LTX SZ6.5 (GLOVE) ×6 IMPLANT
GLOVE SURG UNDER POLY LF SZ6.5 (GLOVE) ×6 IMPLANT
GOWN STRL REUS W/ TWL LRG LVL3 (GOWN DISPOSABLE) ×6 IMPLANT
GOWN STRL REUS W/TWL LRG LVL3 (GOWN DISPOSABLE) ×9
GRASPER SUT TROCAR 14GX15 (MISCELLANEOUS) ×3 IMPLANT
IRRIGATOR SUCT 8 DISP DVNC XI (IRRIGATION / IRRIGATOR) IMPLANT
IRRIGATOR SUCTION 8MM XI DISP (IRRIGATION / IRRIGATOR)
IV NS 1000ML (IV SOLUTION)
IV NS 1000ML BAXH (IV SOLUTION) IMPLANT
KIT PINK PAD W/HEAD ARE REST (MISCELLANEOUS) ×3
KIT PINK PAD W/HEAD ARM REST (MISCELLANEOUS) ×2 IMPLANT
LABEL OR SOLS (LABEL) ×3 IMPLANT
MANIFOLD NEPTUNE II (INSTRUMENTS) ×3 IMPLANT
NEEDLE HYPO 22GX1.5 SAFETY (NEEDLE) ×3 IMPLANT
NEEDLE INSUFFLATION 14GA 120MM (NEEDLE) ×3 IMPLANT
NS IRRIG 500ML POUR BTL (IV SOLUTION) ×3 IMPLANT
OBTURATOR OPTICAL STANDARD 8MM (TROCAR) ×1
OBTURATOR OPTICAL STND 8 DVNC (TROCAR) ×2
OBTURATOR OPTICALSTD 8 DVNC (TROCAR) ×2 IMPLANT
PACK LAP CHOLECYSTECTOMY (MISCELLANEOUS) ×3 IMPLANT
POUCH SPECIMEN RETRIEVAL 10MM (ENDOMECHANICALS) ×3 IMPLANT
SEAL CANN UNIV 5-8 DVNC XI (MISCELLANEOUS) ×6 IMPLANT
SEAL XI 5MM-8MM UNIVERSAL (MISCELLANEOUS) ×3
SET TUBE SMOKE EVAC HIGH FLOW (TUBING) ×3 IMPLANT
SOLUTION ELECTROLUBE (MISCELLANEOUS) ×3 IMPLANT
SPONGE T-LAP 4X18 ~~LOC~~+RFID (SPONGE) IMPLANT
STAPLER CANNULA SEAL DVNC XI (STAPLE) ×2 IMPLANT
STAPLER CANNULA SEAL XI (STAPLE) ×1
SUT MNCRL 4-0 (SUTURE) ×3
SUT MNCRL 4-0 27XMFL (SUTURE) ×2
SUT VICRYL 0 AB UR-6 (SUTURE) ×3 IMPLANT
SUTURE MNCRL 4-0 27XMF (SUTURE) ×2 IMPLANT
WATER STERILE IRR 500ML POUR (IV SOLUTION) ×3 IMPLANT

## 2021-06-14 NOTE — Op Note (Signed)
Preoperative diagnosis: Cholelithiasis  Postoperative diagnosis: Cholelithiasis  Procedure: Robotic Assisted Laparoscopic Cholecystectomy.   Anesthesia: GETA   Surgeon: Dr. Hazle Quant  Wound Classification: Clean Contaminated  Indications: Patient is a 60 y.o. female developed right upper quadrant pain and on workup was found to have cholelithiasis with a normal common duct. Robotic Assisted Laparoscopic cholecystectomy was elected.  Findings: Critical view of safety achieved Cystic duct and artery identified, ligated and divided Adequate hemostasis       Description of procedure: The patient was placed on the operating table in the supine position. General anesthesia was induced. A time-out was completed verifying correct patient, procedure, site, positioning, and implant(s) and/or special equipment prior to beginning this procedure. An orogastric tube was placed. The abdomen was prepped and draped in the usual sterile fashion.  An incision was made in a natural skin line below the umbilicus.  The fascia was elevated and the Veress needle inserted. Proper position was confirmed by aspiration and saline meniscus test.  The abdomen was insufflated with carbon dioxide to a pressure of 15 mmHg. The patient tolerated insufflation well. A 8-mm trocar was then inserted in optiview fashion.  The laparoscope was inserted and the abdomen inspected. No injuries from initial trocar placement were noted. Additional trocars were then inserted in the following locations: an 8-mm trocar in the left lateral abdomen, and another two 8-mm trocars to the right side of the abdomen 5 cm appart. The umbilical trocar was changed to a 12 mm trocar all under direct visualization. The abdomen was inspected and no abnormalities were found. The table was placed in the reverse Trendelenburg position with the right side up. The robotic arms were docked and target anatomy identified. Instrument inserted under direct  visualization.  Filmy adhesions between the gallbladder and omentum, duodenum and transverse colon were lysed with electrocautery. The dome of the gallbladder was grasped with a prograsp and retracted over the dome of the liver. The infundibulum was also grasped with an atraumatic grasper and retracted toward the right lower quadrant. This maneuver exposed Calot's triangle. The peritoneum overlying the gallbladder infundibulum was then incised and the cystic duct and cystic artery identified and circumferentially dissected. Critical view of safety reviewed before ligating any structure. Firefly images taken to visualize biliary ducts. The cystic duct and cystic artery were then doubly clipped and divided close to the gallbladder.  The gallbladder was then dissected from its peritoneal attachments by electrocautery. Hemostasis was checked and the gallbladder and contained stones were removed using an endoscopic retrieval bag. The gallbladder was passed off the table as a specimen. Hemostasis was obtained. There was no evidence of bleeding from the gallbladder fossa or cystic artery or leakage of the bile from the cystic duct stump. Secondary trocars were removed under direct vision. No bleeding was noted. The robotic arms were undoked. The scope was withdrawn and the umbilical trocar removed. The abdomen was allowed to collapse. The fascia of the 41mm trocar sites was closed with figure-of-eight 0 vicryl sutures. The skin was closed with subcuticular sutures of 4-0 monocryl and topical skin adhesive. The orogastric tube was removed.  The patient tolerated the procedure well and was taken to the postanesthesia care unit in stable condition.   Specimen: Gallbladder  Complications: None  EBL: 5 mL

## 2021-06-14 NOTE — Anesthesia Postprocedure Evaluation (Signed)
Anesthesia Post Note  Patient: Keni Kinderman  Procedure(s) Performed: XI ROBOTIC ASSISTED LAPAROSCOPIC CHOLECYSTECTOMY (Abdomen) INDOCYANINE GREEN FLUORESCENCE IMAGING (ICG)  Patient location during evaluation: PACU Anesthesia Type: General Level of consciousness: awake and alert, oriented and patient cooperative Pain management: pain level controlled Vital Signs Assessment: post-procedure vital signs reviewed and stable Respiratory status: spontaneous breathing, nonlabored ventilation and respiratory function stable Cardiovascular status: blood pressure returned to baseline and stable Postop Assessment: adequate PO intake Anesthetic complications: no   No notable events documented.   Last Vitals:  Vitals:   06/14/21 1410 06/14/21 1415  BP:    Pulse:    Resp:    Temp:    SpO2: 92% (!) 88%    Last Pain:  Vitals:   06/14/21 1405  TempSrc: Temporal  PainSc: 6                  Reed Breech

## 2021-06-14 NOTE — Discharge Instructions (Addendum)

## 2021-06-14 NOTE — Anesthesia Preprocedure Evaluation (Addendum)
Anesthesia Evaluation  Patient identified by MRN, date of birth, ID band Patient awake    Reviewed: Allergy & Precautions, NPO status , Patient's Chart, lab work & pertinent test results  History of Anesthesia Complications Negative for: history of anesthetic complications  Airway Mallampati: IV   Neck ROM: Full    Dental  (+) Upper Dentures   Pulmonary Current Smoker (7 cigarettes per day)Patient did not abstain from smoking.,    Pulmonary exam normal breath sounds clear to auscultation       Cardiovascular Normal cardiovascular exam Rhythm:Regular Rate:Normal  ECG 06/07/21: normal  TTE 09/07/19: Normal LV size and wall thickness.  LVEF 60-65% with grade 1 diastolic dysfunction.  Normal RV size and function with mildly elevated pulmonary artery pressure.  There is probably at least mild aortic regurgitation.   Neuro/Psych negative neurological ROS     GI/Hepatic GERD  ,Cholelithiasis    Endo/Other  Class 3 obesity  Renal/GU negative Renal ROS     Musculoskeletal  (+) Arthritis ,   Abdominal   Peds  Hematology negative hematology ROS (+)   Anesthesia Other Findings   Reproductive/Obstetrics                            Anesthesia Physical Anesthesia Plan  ASA: 3  Anesthesia Plan: General   Post-op Pain Management:    Induction: Intravenous  PONV Risk Score and Plan: 2 and Ondansetron, Dexamethasone and Treatment may vary due to age or medical condition  Airway Management Planned: Oral ETT  Additional Equipment:   Intra-op Plan:   Post-operative Plan: Extubation in OR  Informed Consent: I have reviewed the patients History and Physical, chart, labs and discussed the procedure including the risks, benefits and alternatives for the proposed anesthesia with the patient or authorized representative who has indicated his/her understanding and acceptance.     Dental advisory  given  Plan Discussed with: CRNA  Anesthesia Plan Comments: (Patient consented for risks of anesthesia including but not limited to:  - adverse reactions to medications - damage to eyes, teeth, lips or other oral mucosa - nerve damage due to positioning  - sore throat or hoarseness - damage to heart, brain, nerves, lungs, other parts of body or loss of life  Informed patient about role of CRNA in peri- and intra-operative care.  Patient voiced understanding.)        Anesthesia Quick Evaluation

## 2021-06-14 NOTE — Interval H&P Note (Signed)
History and Physical Interval Note:  06/14/2021 11:13 AM  Chloe Farley  has presented today for surgery, with the diagnosis of K80.20 Cholelithiasis w/o cholecystitis.  The various methods of treatment have been discussed with the patient and family. After consideration of risks, benefits and other options for treatment, the patient has consented to  Procedure(s): XI ROBOTIC ASSISTED LAPAROSCOPIC CHOLECYSTECTOMY (N/A) INDOCYANINE GREEN FLUORESCENCE IMAGING (ICG) (N/A) as a surgical intervention.  The patient's history has been reviewed, patient examined, no change in status, stable for surgery.  I have reviewed the patient's chart and labs.  Questions were answered to the patient's satisfaction.     Carolan Shiver

## 2021-06-14 NOTE — Transfer of Care (Signed)
Immediate Anesthesia Transfer of Care Note  Patient: Chloe Farley  Procedure(s) Performed: XI ROBOTIC ASSISTED LAPAROSCOPIC CHOLECYSTECTOMY (Abdomen) INDOCYANINE GREEN FLUORESCENCE IMAGING (ICG)  Patient Location: PACU  Anesthesia Type:General  Level of Consciousness: awake and alert   Airway & Oxygen Therapy: Patient Spontanous Breathing  Post-op Assessment: Report given to RN  Post vital signs: Reviewed and stable  Last Vitals:  Vitals Value Taken Time  BP 126/74 06/14/21 1241  Temp    Pulse 87 06/14/21 1242  Resp 16 06/14/21 1242  SpO2 95 % 06/14/21 1242  Vitals shown include unvalidated device data.  Last Pain:  Vitals:   06/14/21 1110  TempSrc: Temporal  PainSc: 0-No pain         Complications: No notable events documented.

## 2021-06-14 NOTE — Anesthesia Procedure Notes (Signed)
Procedure Name: Intubation Date/Time: 06/14/2021 11:54 AM Performed by: Lesle Reek, CRNA Pre-anesthesia Checklist: Patient identified, Patient being monitored, Timeout performed, Emergency Drugs available and Suction available Patient Re-evaluated:Patient Re-evaluated prior to induction Oxygen Delivery Method: Circle system utilized Preoxygenation: Pre-oxygenation with 100% oxygen Induction Type: IV induction Ventilation: Mask ventilation without difficulty Laryngoscope Size: Mac and 3 Grade View: Grade I Tube type: Oral Tube size: 7.0 mm Number of attempts: 1 Airway Equipment and Method: Stylet Placement Confirmation: ETT inserted through vocal cords under direct vision, positive ETCO2 and breath sounds checked- equal and bilateral Secured at: 21 cm Tube secured with: Tape Dental Injury: Teeth and Oropharynx as per pre-operative assessment

## 2021-06-14 NOTE — Progress Notes (Signed)
Patient sats 90 % RA most of time after nebulizer tx.  Occasionally sats will go to 89% but do increase back with incentive spirometry.  Called and spoke with Dr Suzan Slick. Ok for discharge if patient is asymptomatic to sats of 89-90.  Educated pt and family on doing incentive spirometry at least 10 x every hour while awake. Also informed pt and family on reasons to go to ED or call 911.  Verbalized understanding.

## 2021-06-15 ENCOUNTER — Other Ambulatory Visit: Payer: Self-pay

## 2021-06-15 ENCOUNTER — Encounter: Payer: Self-pay | Admitting: Emergency Medicine

## 2021-06-15 ENCOUNTER — Emergency Department
Admission: EM | Admit: 2021-06-15 | Discharge: 2021-06-15 | Disposition: A | Discharge status: Home / Self Care | Payer: Medicaid Other | Attending: Emergency Medicine | Admitting: Emergency Medicine

## 2021-06-15 ENCOUNTER — Emergency Department: Payer: Medicaid Other

## 2021-06-15 DIAGNOSIS — Z96643 Presence of artificial hip joint, bilateral: Secondary | ICD-10-CM | POA: Diagnosis not present

## 2021-06-15 DIAGNOSIS — G8918 Other acute postprocedural pain: Secondary | ICD-10-CM | POA: Diagnosis present

## 2021-06-15 DIAGNOSIS — R1012 Left upper quadrant pain: Secondary | ICD-10-CM | POA: Insufficient documentation

## 2021-06-15 DIAGNOSIS — L7682 Other postprocedural complications of skin and subcutaneous tissue: Secondary | ICD-10-CM

## 2021-06-15 DIAGNOSIS — F17210 Nicotine dependence, cigarettes, uncomplicated: Secondary | ICD-10-CM | POA: Diagnosis not present

## 2021-06-15 LAB — COMPREHENSIVE METABOLIC PANEL
ALT: 32 U/L (ref 0–44)
AST: 34 U/L (ref 15–41)
Albumin: 3.8 g/dL (ref 3.5–5.0)
Alkaline Phosphatase: 70 U/L (ref 38–126)
Anion gap: 8 (ref 5–15)
BUN: 24 mg/dL — ABNORMAL HIGH (ref 8–23)
CO2: 22 mmol/L (ref 22–32)
Calcium: 8.8 mg/dL — ABNORMAL LOW (ref 8.9–10.3)
Chloride: 102 mmol/L (ref 98–111)
Creatinine, Ser: 1.12 mg/dL — ABNORMAL HIGH (ref 0.44–1.00)
GFR, Estimated: 56 mL/min — ABNORMAL LOW (ref >60)
Glucose, Bld: 134 mg/dL — ABNORMAL HIGH (ref 70–99)
Potassium: 4 mmol/L (ref 3.5–5.1)
Sodium: 132 mmol/L — ABNORMAL LOW (ref 135–145)
Total Bilirubin: 0.6 mg/dL (ref 0.3–1.2)
Total Protein: 6.9 g/dL (ref 6.5–8.1)

## 2021-06-15 LAB — CBC
HCT: 40.4 % (ref 36.0–46.0)
Hemoglobin: 13.5 g/dL (ref 12.0–15.0)
MCH: 30.8 pg (ref 26.0–34.0)
MCHC: 33.4 g/dL (ref 30.0–36.0)
MCV: 92.2 fL (ref 80.0–100.0)
Platelets: 248 10*3/uL (ref 150–400)
RBC: 4.38 MIL/uL (ref 3.87–5.11)
RDW: 12.5 % (ref 11.5–15.5)
WBC: 19.6 10*3/uL — ABNORMAL HIGH (ref 4.0–10.5)
nRBC: 0 % (ref 0.0–0.2)

## 2021-06-15 LAB — LIPASE, BLOOD: Lipase: 29 U/L (ref 11–51)

## 2021-06-15 MED ORDER — PROMETHAZINE HCL 12.5 MG PO TABS: 12.5000 mg | ORAL_TABLET | Freq: Four times a day (QID) | ORAL | 0 refills | Status: DC | PRN

## 2021-06-15 MED ORDER — OXYCODONE-ACETAMINOPHEN 5-325 MG PO TABS: 1.0000 | ORAL_TABLET | Freq: Four times a day (QID) | ORAL | 0 refills | Status: DC | PRN

## 2021-06-15 MED ORDER — IOHEXOL 300 MG/ML  SOLN
80.0000 mL | Freq: Once | INTRAMUSCULAR | Status: AC | PRN
  Administered 2021-06-15: 80 mL via INTRAVENOUS
  Filled 2021-06-15: qty 80

## 2021-06-15 NOTE — ED Provider Notes (Signed)
[facility] Emergency Department Provider Note  ____________________________________________  Time seen: Approximately 4:08 PM  I have reviewed the triage vital signs and the nursing notes.   HISTORY  Chief Complaint Abdominal Pain    HPI Chloe Farley is a 60 y.o. female who presents the emergency department complaining of left-sided abdominal pain following cholecystectomy performed yesterday.  Patient had no complications during surgery, states that she went home and felt "okay."  Patient has developed pain and some firmness and swelling around one of the incisional sites to the left side of the abdomen.  No vomiting, diarrhea or constipation reported.  Patient states that she has no pain to the right side of the abdomen.  Pain seems to be localized around one of the incisional sites to the left abdominal wall.  However she states that the pain seems to radiate underneath the incisional site into her abdomen.  Describes it as a stabbing sensation.  Patient states that she has felt "hot" but has been afebrile at home and is afebrile here upon arrival.       Past Medical History:  Diagnosis Date   Dyspnea    When anxious   Hyperlipidemia    Osteoarthritis of both knees    Vitamin D deficiency     Patient Active Problem List   Diagnosis Date Noted   Leg cramp 01/11/2020   Lightheadedness 08/17/2019   Atypical chest pain 08/17/2019   Bilateral carotid artery stenosis 08/17/2019   Hyperlipidemia 08/17/2019   Morbid obesity ([place]) 08/17/2019   Osteoarthritis of knee 11/24/2017    Past Surgical History:  Procedure Laterality Date   TOTAL HIP ARTHROPLASTY     right & left    TUBAL LIGATION  1985    Prior to Admission medications   Medication Sig Start Date End Date Taking? Authorizing Provider  oxyCODONE-acetaminophen (PERCOCET/ROXICET) 5-325 MG tablet Take 1 tablet by mouth every 6 (six) hours as needed for severe pain. 06/15/21  Yes Kenly Xiao,  Charline Bills, PA-C  promethazine (PHENERGAN) 12.5 MG tablet Take 1 tablet (12.5 mg total) by mouth every 6 (six) hours as needed for nausea or vomiting. 06/15/21  Yes Loden Laurent, Charline Bills, PA-C  acetaminophen (TYLENOL) 500 MG tablet Take 1,000 mg by mouth every 6 (six) hours as needed (pain.).    [provider]  chlorhexidine (PERIDEX) 0.12 % solution Use as directed 7.5 mLs in the mouth or throat 2 (two) times daily.    [provider]  HYDROcodone-acetaminophen (NORCO) 5-325 MG tablet Take 1 tablet by mouth every 4 (four) hours as needed for up to 3 days for moderate pain. 06/14/21 06/17/21  Herbert Pun, MD  HYDROcodone-acetaminophen (NORCO/VICODIN) 5-325 MG tablet Take 1 tablet by mouth every 4 (four) hours as needed. Patient not taking: Reported on 06/12/2021 06/06/21   Harvest Dark, MD  ibuprofen (ADVIL) 200 MG tablet Take 400 mg by mouth every 8 (eight) hours as needed (for pain.).    [provider]  Menthol, Topical Analgesic, (BIOFREEZE EX) Apply 1 application topically 2 (two) times daily as needed (cramping (legs)).    [provider]  ondansetron (ZOFRAN) 4 MG tablet Take 1 tablet (4 mg total) by mouth daily as needed for nausea or vomiting. Patient not taking: Reported on 06/12/2021 06/06/21   Harvest Dark, MD  varenicline (CHANTIX) 1 MG tablet Take 1 mg by mouth 2 (two) times daily.    [provider]    Allergies Rofecoxib, Sulfa antibiotics, Depakote [divalproex sodium], Meloxicam, and  Zofran [ondansetron hcl]  Family History  Problem Relation Age of Onset   Cancer Mother    Hyperlipidemia Father    Heart attack Father    Hypertension Sister    Hyperlipidemia Sister    Head & neck cancer Brother     Social History Social History   Tobacco Use   Smoking status: Every Day    Packs/day: 0.25    Years: 15.00    Pack years: 3.75    Types: Cigarettes    Last attempt to quit: 04/13/2020    Years since  quitting: 1.1   Smokeless tobacco: Never  Vaping Use   Vaping Use: Never used  Substance Use Topics   Alcohol use: Never   Drug use: Never     Review of Systems  Constitutional: No fever/chills Eyes: No visual changes. No discharge ENT: No upper respiratory complaints. Cardiovascular: no chest pain. Respiratory: no cough. No SOB. Gastrointestinal: Left-sided abdominal wall/abdominal pain.  Localized around left-sided abdominal incision from her cholecystectomy yesterday.  No nausea, no vomiting.  No diarrhea.  No constipation. Genitourinary: Negative for dysuria. No hematuria Musculoskeletal: Negative for musculoskeletal pain. Skin: Negative for rash, abrasions, lacerations, ecchymosis. Neurological: Negative for headaches, focal weakness or numbness.  10 System ROS otherwise negative.  ____________________________________________   PHYSICAL EXAM:  VITAL SIGNS: ED Triage Vitals  Enc Vitals Group     BP 06/15/21 1555 (!) 100/58     Pulse Rate 06/15/21 1555 74     Resp 06/15/21 1555 20     Temp 06/15/21 1555 98.1 F (36.7 C)     Temp Source 06/15/21 1555 Oral     SpO2 06/15/21 1555 95 %     Weight 06/15/21 1553 233 lb 14.5 oz (106.1 kg)     Height 06/15/21 1553 5\' 3"  (1.6 m)     Head Circumference --      Peak Flow --      Pain Score 06/15/21 1552 10     Pain Loc --      Pain Edu? --      Excl. in GC? --      Constitutional: Alert and oriented. Well appearing and in no acute distress. Eyes: Conjunctivae are normal. PERRL. EOMI. Head: Atraumatic. ENT:      Ears:       Nose: No congestion/rhinnorhea.      Mouth/Throat: Mucous membranes are moist.  Neck: No stridor.   Cardiovascular: Normal rate, regular rhythm. Normal S1 and S2.  Good peripheral circulation. Respiratory: Normal respiratory effort without tachypnea or retractions. Lungs CTAB. Good air entry to the bases with no decreased or absent breath sounds. Gastrointestinal: Bowel sounds 4 quadrants.  Soft  to palpation.  Patient is tender to the left upper abdominal region.  Majority of tenderness involves surgical site to the left upper abdominal wall.  There is a palpable firmness in this area.  There is no erythema of the abdominal wall.  No drainage from the surgical site.  No dehiscence of the surgical site.  Guarding in the specific area, however no other guarding to the abdomen.  There is no distention of the abdomen.. No palpable masses. No distention. No CVA tenderness. Musculoskeletal: Full range of motion to all extremities. No gross deformities appreciated. Neurologic:  Normal speech and language. No gross focal neurologic deficits are appreciated.  Skin:  Skin is warm, dry and intact. No rash noted. Psychiatric: Mood and affect are normal. Speech and behavior are normal. Patient exhibits appropriate insight and  judgement.   ____________________________________________   LABS (all labs ordered are listed, but only abnormal results are displayed)  Labs Reviewed  COMPREHENSIVE METABOLIC PANEL - Abnormal; Notable for the following components:      Result Value   Sodium 132 (*)    Glucose, Bld 134 (*)    BUN 24 (*)    Creatinine, Ser 1.12 (*)    Calcium 8.8 (*)    GFR, Estimated 56 (*)    All other components within normal limits  CBC - Abnormal; Notable for the following components:   WBC 19.6 (*)    All other components within normal limits  LIPASE, BLOOD  URINALYSIS, ROUTINE W REFLEX MICROSCOPIC   ____________________________________________  EKG   ____________________________________________  RADIOLOGY I personally viewed and evaluated these images as part of my medical decision making, as well as reviewing the written report by the radiologist.  ED Provider Interpretation: CT is reassuring.  Is no evidence of biloma, postsurgical complications.  Incidental finding of enlarged ovarian veins, however patient is having no pelvic pain, has having pain around the left  upper abdomen incisional site for her cholecystectomy.  CT ABDOMEN PELVIS W CONTRAST  Result Date: 06/15/2021 CLINICAL DATA:  Status post cholecystectomy yesterday, left-sided abdominal pain, swelling about incision EXAM: CT ABDOMEN AND PELVIS WITH CONTRAST TECHNIQUE: Multidetector CT imaging of the abdomen and pelvis was performed using the standard protocol following bolus administration of intravenous contrast. CONTRAST:  29mL OMNIPAQUE IOHEXOL 300 MG/ML  SOLN COMPARISON:  None. FINDINGS: Lower chest: No acute abnormality. Hepatobiliary: No focal liver abnormality is seen. Status post cholecystectomy. No biliary dilatation. Pancreas: Unremarkable. No pancreatic ductal dilatation or surrounding inflammatory changes. Spleen: Normal in size without significant abnormality. Adrenals/Urinary Tract: Adrenal glands are unremarkable. Kidneys are normal, without renal calculi, solid lesion, or hydronephrosis. Bladder is unremarkable. Stomach/Bowel: Stomach is within normal limits. Appendix appears normal. No evidence of bowel wall thickening, distention, or inflammatory changes. Sigmoid diverticulosis. Vascular/Lymphatic: Aortic atherosclerosis. Prominent bilateral ovarian veins and bilateral uterine and adnexal varices (series 4, image 70). No enlarged abdominal or pelvic lymph nodes. Reproductive: No mass or other obvious significant abnormality. Dense metallic streak artifact from bilateral hip arthroplasty somewhat limits evaluation of the low pelvis. Other: No abdominal wall hernia or abnormality. No abdominopelvic ascites. Tiny foci of free air, predominantly in the left upper quadrant (series 4, image 15). Musculoskeletal: No acute or significant osseous findings. Status post bilateral hip total arthroplasty. IMPRESSION: 1. Tiny foci of free air, predominantly in the left upper quadrant, consistent with recent cholecystectomy. 2. No evidence of fluid collection or other postoperative complication following  cholecystectomy. 3. Prominent bilateral ovarian veins and bilateral uterine and adnexal varices, which can be seen in pelvic congestion syndrome if clinically referable signs and symptoms are present. 4. Sigmoid diverticulosis without evidence of acute diverticulitis. Aortic Atherosclerosis (ICD10-I70.0). Electronically Signed   By: Delanna Ahmadi M.D.   On: 06/15/2021 17:44    ____________________________________________    PROCEDURES  Procedure(s) performed:    Procedures    Medications  iohexol (OMNIPAQUE) 300 MG/ML solution 80 mL (80 mLs Intravenous Contrast Given 06/15/21 1727)     ____________________________________________   INITIAL IMPRESSION / ASSESSMENT AND PLAN / ED COURSE  Pertinent labs & imaging results that were available during my care of the patient were reviewed by me and considered in my medical decision making (see chart for details).  Review of the [place] CSRS was performed in accordance of the [place] prior to dispensing any controlled drugs.  Clinical Course as of 06/15/21 1821  Sat Jun 15, 2021  1625 Patient presents with left-sided abdominal/left-sided abdominal wall pain after cholecystectomy yesterday.  No right-sided abdominal pain.  No emesis, diarrhea or constipation.  No urinary changes.  Patient is concerned that surgical site may have complication as she is having pain primarily around the upper left sided incisional site with "firmness/swelling in the area.  Area does have a palpable abnormality appears to be in the abdominal wall.  Guarding in this area.  As symptoms are more left-sided after her cholecystectomy I will perform CT scan to evaluate entire abdomen including abdominal wall for evaluation.  Patient will also have labs at this time. [JC]    Clinical Course User Index [JC] Ghalia Reicks, Charline Bills, PA-C          Patient's diagnosis is consistent with incisional pain.  Patient presents emergency department complaining of left upper abdominal  pain around her surgical site.  Patient is having pain around one of the surgical incisions used yesterday to perform her cholecystectomy.  No right upper quadrant pain.  No fevers at home.  Labs are overall reassuring at this time.  Elevated white blood cell count the patient does have a chronically elevated white blood cell count plus underwent surgery yesterday.  Has no other signs of infection at this time.  CT scan was reassuring with no evidence of postsurgical complication..  Pain appears to be localized to her left upper abdominal wall.  This time wound care instructions discussed with the patient.  Patient has been performing household chores today and I encouraged the patient to rest.  Follow-up with surgeon.  Return precautions discussed with the patient.  Patient is given ED precautions to return to the ED for any worsening or new symptoms.     ____________________________________________  FINAL CLINICAL IMPRESSION(S) / ED DIAGNOSES  Final diagnoses:  Incisional pain      NEW MEDICATIONS STARTED DURING THIS VISIT:  ED Discharge Orders          Ordered    oxyCODONE-acetaminophen (PERCOCET/ROXICET) 5-325 MG tablet  Every 6 hours PRN        06/15/21 1819    promethazine (PHENERGAN) 12.5 MG tablet  Every 6 hours PRN        06/15/21 1819                This chart was dictated using voice recognition software/Dragon. Despite best efforts to proofread, errors can occur which can change the meaning. Any change was purely unintentional.    Darletta Moll, PA-C 06/15/21 1821    Carrie Mew, MD 06/16/21 2122

## 2021-06-15 NOTE — ED Triage Notes (Signed)
Pt reports had her gallbladder removed yesterday and today started with severe sharp pressure like pain just above the incision site. Pt also reports feels flushed but was not running a temperature at home. Pt face red and flushed.

## 2021-06-18 LAB — SURGICAL PATHOLOGY

## 2021-06-24 ENCOUNTER — Ambulatory Visit
Admission: RE | Admit: 2021-06-24 | Discharge: 2021-06-24 | Disposition: A | Discharge status: Home / Self Care | Payer: Medicaid Other | Source: Ambulatory Visit | Attending: Primary Care | Admitting: Primary Care

## 2021-06-24 ENCOUNTER — Other Ambulatory Visit: Payer: Self-pay

## 2021-06-24 DIAGNOSIS — Z1231 Encounter for screening mammogram for malignant neoplasm of breast: Secondary | ICD-10-CM | POA: Insufficient documentation

## 2023-04-23 ENCOUNTER — Other Ambulatory Visit: Payer: Self-pay | Admitting: Primary Care

## 2023-04-23 ENCOUNTER — Ambulatory Visit
Admission: RE | Admit: 2023-04-23 | Discharge: 2023-04-23 | Disposition: A | Discharge status: Home / Self Care | Payer: Medicaid Other | Source: Ambulatory Visit | Attending: Primary Care | Admitting: Primary Care

## 2023-04-23 DIAGNOSIS — Z1231 Encounter for screening mammogram for malignant neoplasm of breast: Secondary | ICD-10-CM

## 2023-04-23 DIAGNOSIS — R0602 Shortness of breath: Secondary | ICD-10-CM

## 2023-04-29 ENCOUNTER — Other Ambulatory Visit: Payer: Self-pay | Admitting: Orthopedic Surgery

## 2023-04-29 DIAGNOSIS — Z96641 Presence of right artificial hip joint: Secondary | ICD-10-CM

## 2023-05-12 ENCOUNTER — Other Ambulatory Visit: Payer: Medicaid Other

## 2023-05-13 ENCOUNTER — Ambulatory Visit
Admission: RE | Admit: 2023-05-13 | Discharge: 2023-05-13 | Disposition: A | Discharge status: Home / Self Care | Payer: Medicaid Other | Source: Ambulatory Visit | Attending: Primary Care | Admitting: Primary Care

## 2023-05-13 DIAGNOSIS — Z1231 Encounter for screening mammogram for malignant neoplasm of breast: Secondary | ICD-10-CM | POA: Insufficient documentation

## 2023-05-14 ENCOUNTER — Encounter
Admission: RE | Admit: 2023-05-14 | Discharge: 2023-05-14 | Disposition: A | Discharge status: Home / Self Care | Payer: Medicaid Other | Source: Ambulatory Visit | Attending: Orthopedic Surgery | Admitting: Orthopedic Surgery

## 2023-05-14 DIAGNOSIS — Z96641 Presence of right artificial hip joint: Secondary | ICD-10-CM | POA: Insufficient documentation

## 2023-05-14 MED ORDER — TECHNETIUM TC 99M MEDRONATE IV KIT
20.0000 | PACK | Freq: Once | INTRAVENOUS | Status: AC | PRN
  Administered 2023-05-14: 18.97 via INTRAVENOUS

## 2023-05-24 IMAGING — MG MM DIGITAL SCREENING BILAT W/ TOMO AND CAD
8 series · 8 of 24 positions shown · non-contrast
Comparison: Previous exam(s).

CLINICAL DATA: Screening.

EXAM:
DIGITAL SCREENING BILATERAL MAMMOGRAM WITH TOMOSYNTHESIS AND CAD
TECHNIQUE: Bilateral screening digital craniocaudal and mediolateral oblique
mammograms were obtained. Bilateral screening digital breast
tomosynthesis was performed. The images were evaluated with
computer-aided detection.

[L MLO synth-2D]
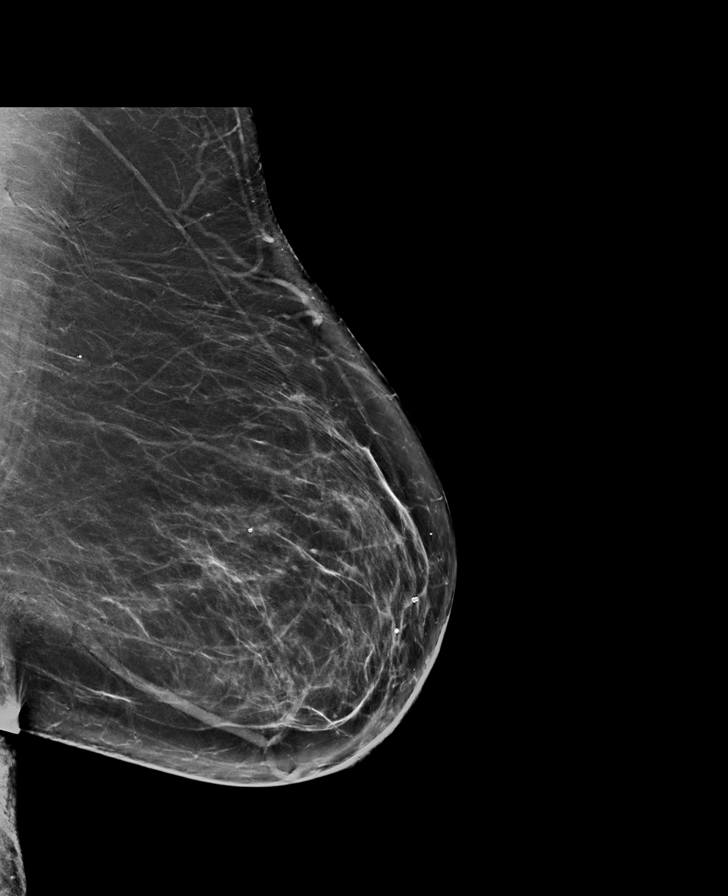

[R CC synth-2D]
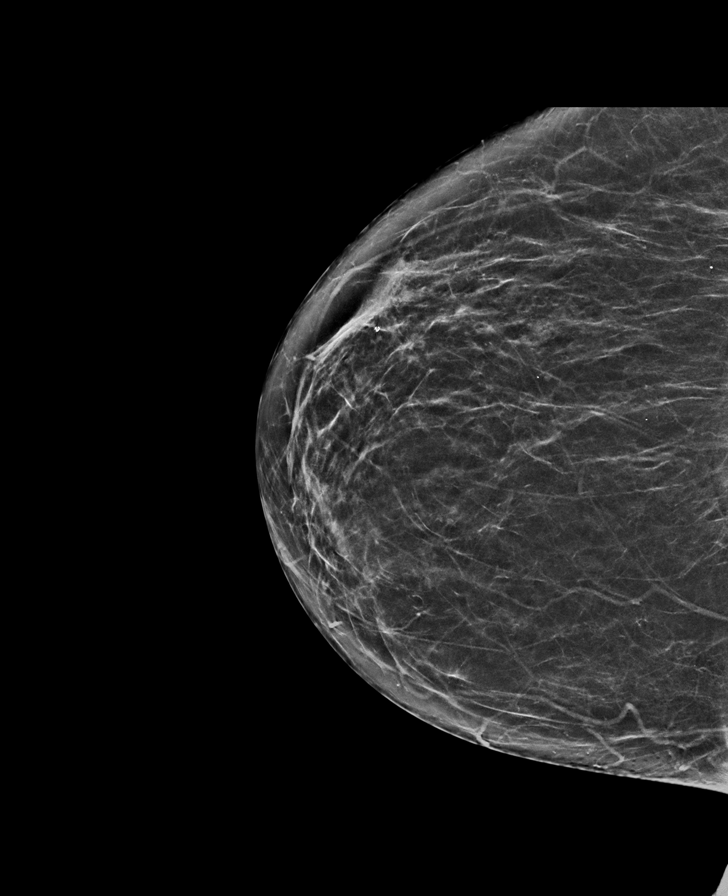

[L CC synth-2D]
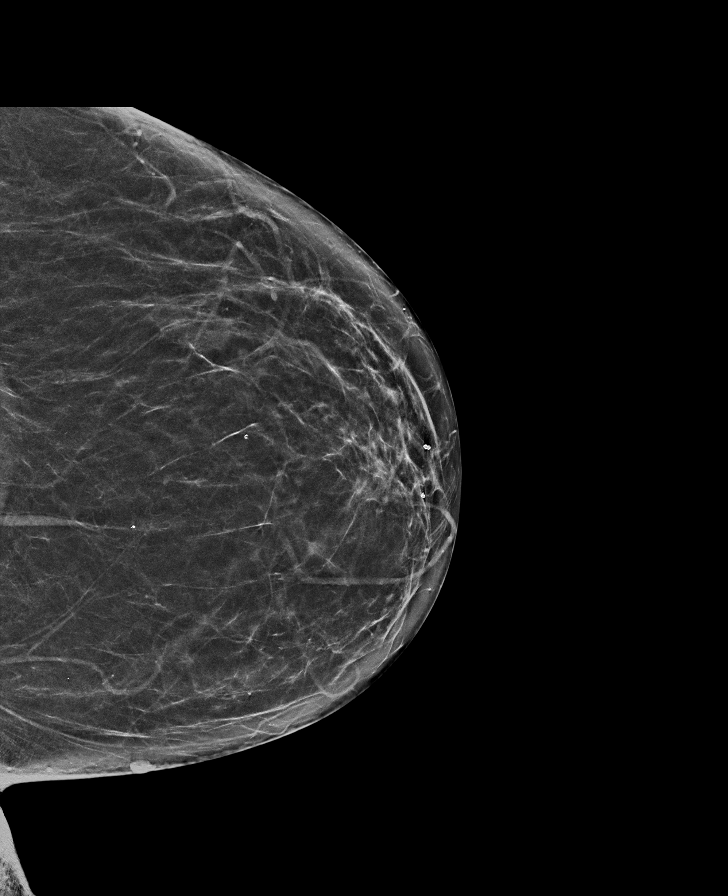

[R MLO synth-2D]
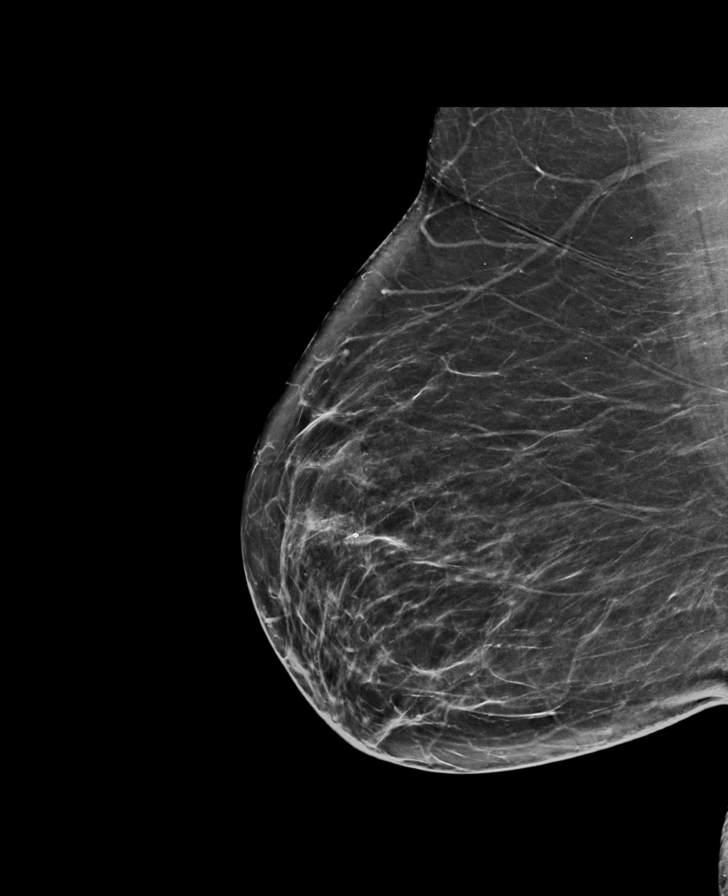

[R MLO tomo · tomo slice 37/74.0]
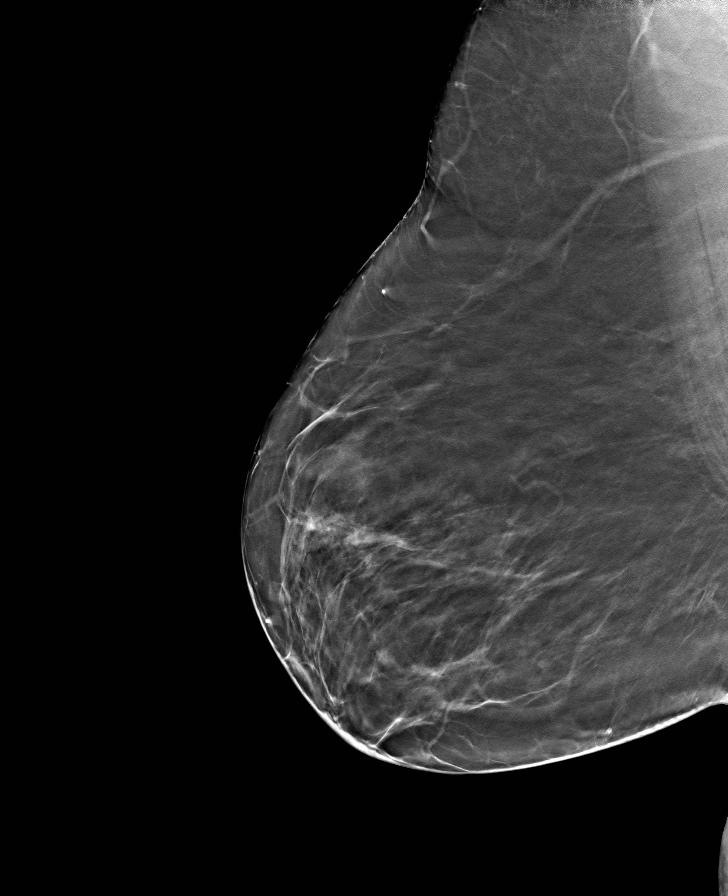

[R CC tomo · tomo slice 33/66.0]
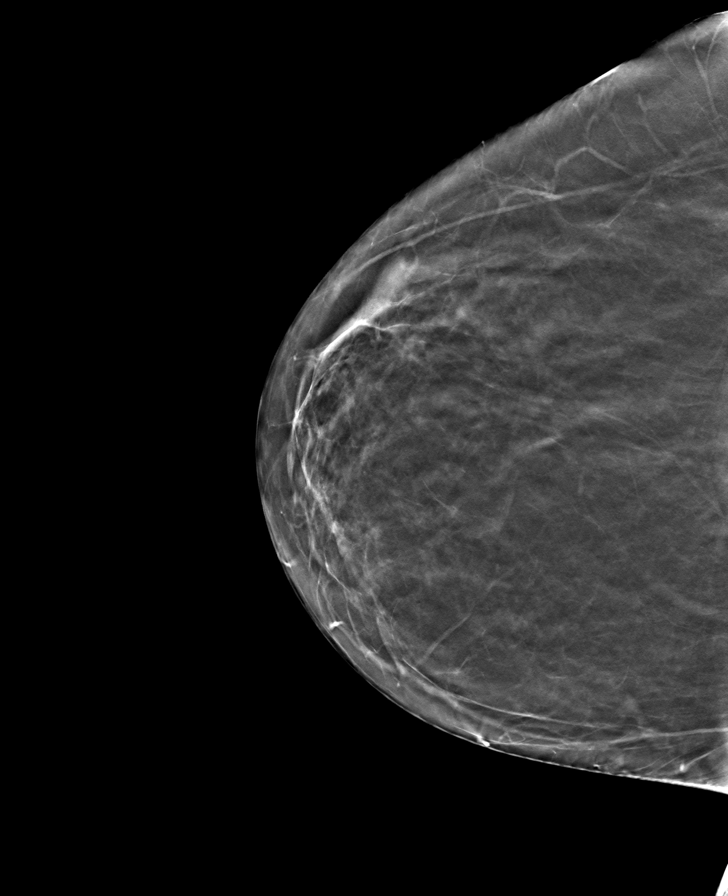

[L CC tomo · tomo slice 33/66.0]
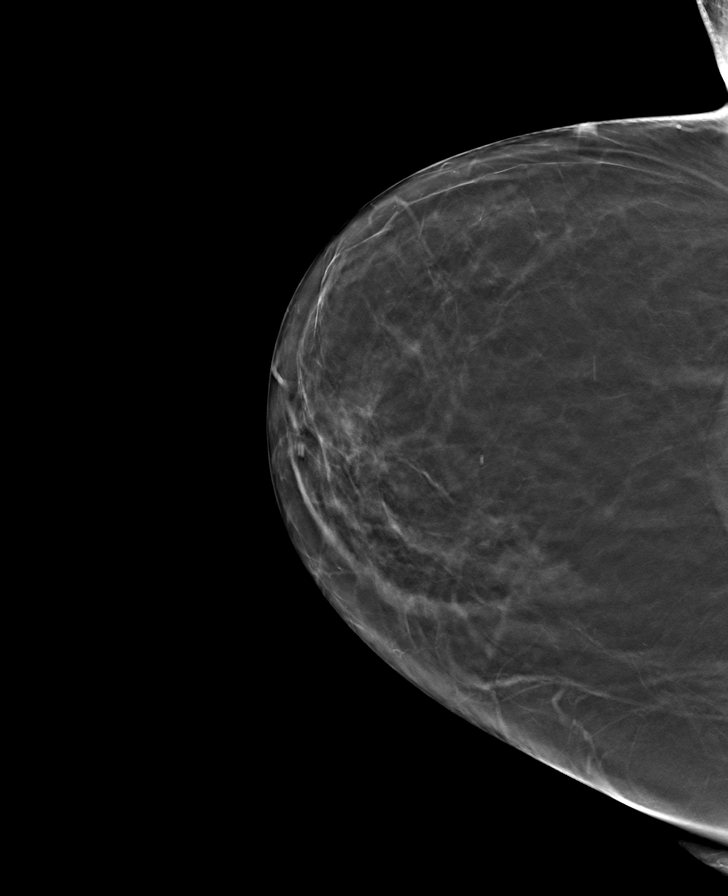

[L MLO tomo · tomo slice 39/76.0]
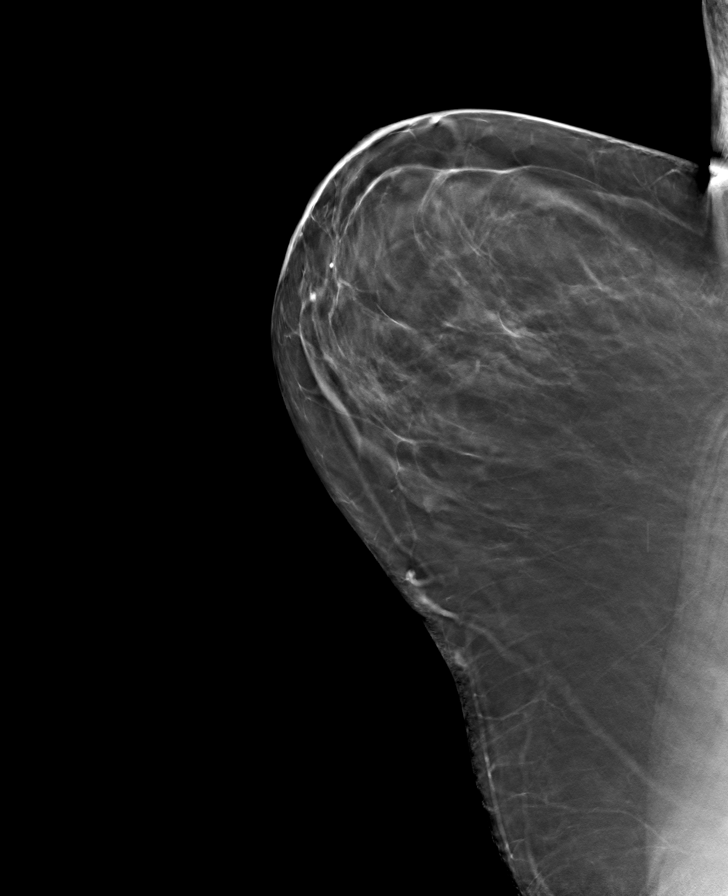

[8 of 24 positions shown; findings below may reference images not displayed]

ACR Breast Density Category b: There are scattered areas of
fibroglandular density.
FINDINGS: There are no findings suspicious for malignancy.
IMPRESSION: No mammographic evidence of malignancy. A result letter of this
screening mammogram will be mailed directly to the patient.

RECOMMENDATION:
Screening mammogram in one year. (Code:51-O-LD2)

BI-RADS CATEGORY  1: Negative.

## 2023-10-16 ENCOUNTER — Other Ambulatory Visit: Payer: Self-pay

## 2023-10-16 ENCOUNTER — Emergency Department

## 2023-10-16 ENCOUNTER — Emergency Department
Admission: EM | Admit: 2023-10-16 | Discharge: 2023-10-16 | Disposition: A | Discharge status: Home / Self Care | Attending: Emergency Medicine | Admitting: Emergency Medicine

## 2023-10-16 DIAGNOSIS — R519 Headache, unspecified: Secondary | ICD-10-CM | POA: Insufficient documentation

## 2023-10-16 DIAGNOSIS — Z8616 Personal history of COVID-19: Secondary | ICD-10-CM | POA: Diagnosis not present

## 2023-10-16 LAB — BASIC METABOLIC PANEL WITH GFR
Anion gap: 8 (ref 5–15)
BUN: 20 mg/dL (ref 8–23)
CO2: 25 mmol/L (ref 22–32)
Calcium: 8.8 mg/dL — ABNORMAL LOW (ref 8.9–10.3)
Chloride: 103 mmol/L (ref 98–111)
Creatinine, Ser: 0.9 mg/dL (ref 0.44–1.00)
GFR, Estimated: >60 mL/min (ref >60)
Glucose, Bld: 103 mg/dL — ABNORMAL HIGH (ref 70–99)
Potassium: 4.6 mmol/L (ref 3.5–5.1)
Sodium: 136 mmol/L (ref 135–145)

## 2023-10-16 LAB — CBC
HCT: 41.8 % (ref 36.0–46.0)
Hemoglobin: 14.2 g/dL (ref 12.0–15.0)
MCH: 30.6 pg (ref 26.0–34.0)
MCHC: 34 g/dL (ref 30.0–36.0)
MCV: 90.1 fL (ref 80.0–100.0)
Platelets: 256 10*3/uL (ref 150–400)
RBC: 4.64 MIL/uL (ref 3.87–5.11)
RDW: 12.9 % (ref 11.5–15.5)
WBC: 8.5 10*3/uL (ref 4.0–10.5)
nRBC: 0 % (ref 0.0–0.2)

## 2023-10-16 LAB — RESP PANEL BY RT-PCR (RSV, FLU A&B, COVID)  RVPGX2
Influenza A by PCR: NEGATIVE
Influenza B by PCR: NEGATIVE
Resp Syncytial Virus by PCR: NEGATIVE
SARS Coronavirus 2 by RT PCR: NEGATIVE

## 2023-10-16 LAB — SEDIMENTATION RATE: Sed Rate: 26 mm/h (ref 0–30)

## 2023-10-16 MED ORDER — PROMETHAZINE HCL 12.5 MG PO TABS
12.5000 mg | ORAL_TABLET | Freq: Three times a day (TID) | ORAL | 0 refills | Status: AC | PRN
Start: 2023-10-16 — End: ?

## 2023-10-16 MED ORDER — BUTALBITAL-APAP-CAFFEINE 50-325-40 MG PO TABS: 1.0000 | ORAL_TABLET | Freq: Three times a day (TID) | ORAL | 0 refills | Status: DC | PRN

## 2023-10-16 MED ORDER — SODIUM CHLORIDE 0.9 % IV BOLUS
500.0000 mL | Freq: Once | INTRAVENOUS | Status: AC
  Administered 2023-10-16: 500 mL via INTRAVENOUS

## 2023-10-16 MED ORDER — METOCLOPRAMIDE HCL 5 MG/ML IJ SOLN
5.0000 mg | Freq: Once | INTRAMUSCULAR | Status: AC
  Administered 2023-10-16: 5 mg via INTRAVENOUS
  Filled 2023-10-16: qty 2

## 2023-10-16 NOTE — ED Provider Notes (Signed)
 [facility] Provider Note    Event Date/Time   First MD Initiated Contact with Patient 10/16/23 [phone]     (approximate)   History   Headache  HPI  Chloe Farley is a 63 y.o. female reports of distant history of cancer 30 years ago.  She also reports history of osteoarthritis treated with no prescription medications just turmeric  For about 5 days she has had a "roaming headache" sort of moves about sometimes in the back pointing towards her occiput sometimes moves around to the forehead.  She reports it will get better somewhat with ibuprofen.  It came on slowly and just seems to be lingering.  Went at its worst she feels like there is a little bit of a blurriness in vision.  She has been feeling a little bit lightheaded but not necessarily off-balance.  There is been no cough no fevers no nasal congestion no pain or burning with urination no other symptoms.  Just sort of a persistence of this headache and it seems to move about.  No fever no neck pain or stiffness.  She relates that she did a home COVID test which was negative and she reports when she had COVID a few years ago predominant symptom was a headache.  She has had no numbness or weakness no difficulty speaking.  No facial droop   Distant history of migraine headaches 20 to 25 years ago  Physical Exam   Triage Vital Signs: ED Triage Vitals  Encounter Vitals Group     BP 10/16/23 0733 (!) 131/53     Systolic BP Percentile --      Diastolic BP Percentile --      Pulse Rate 10/16/23 0731 89     Resp 10/16/23 0731 16     Temp 10/16/23 0731 98.3 F (36.8 C)     Temp Source 10/16/23 0731 Oral     SpO2 10/16/23 0731 96 %     Weight 10/16/23 0732 233 lb 14.5 oz (106.1 kg)     Height --      Head Circumference --      Peak Flow --      Pain Score 10/16/23 0732 10     Pain Loc --      Pain Education --      Exclude from Growth Chart --     Most recent vital signs: Vitals:   10/16/23 1030  10/16/23 1100  BP: 122/66 (!) 149/64  Pulse: 77 70  Resp: 18   Temp:    SpO2: 93% 95%     General: Awake, no distress.  Normocephalic atraumatic.  She is fully alert very pleasant does not appear in acute distress or extremis.  Conjunctivae are normal extraocular moods are normal.  Speech is clear.  She moves all extremities with 5 out of 5 strength has normal sensation face arms hands bilateral and no pronator drift in any extremity.  She has no meningismus demonstrates full range of motion of the neck without pain or difficulty.  Currently reports the headache is located over the left posterior occipital region, no lesion or tenderness noted focally Denies eye pain. Checked intraocular pressures with Tono-Pen.  Right eye 22, left eye 18.  Discerns vision with her glasses on currently reports that she has not experienced any blurriness  CV:  Good peripheral perfusion.  Resp:  Normal effort.  Clear bilateral Abd:  No distention.  Other:  No lower extremity rashes noted.  Patient denies  any falls injuries trauma or rashes   ED Results / Procedures / Treatments   Labs (all labs ordered are listed, but only abnormal results are displayed) Labs Reviewed  BASIC METABOLIC PANEL WITH GFR - Abnormal; Notable for the following components:      Result Value   Glucose, Bld 103 (*)    Calcium 8.8 (*)    All other components within normal limits  RESP PANEL BY RT-PCR (RSV, FLU A&B, COVID)  RVPGX2  CBC  SEDIMENTATION RATE     EKG     RADIOLOGY  MR BRAIN WO CONTRAST Result Date: 10/16/2023 CLINICAL DATA:  Provided history: Neuro deficit, acute, stroke suspected. Five days of headache with nausea, atypical of prior migraines. Additional history provided: blurred vision, dizziness. EXAM: MRI HEAD WITHOUT CONTRAST TECHNIQUE: Multiplanar, multiecho pulse sequences of the brain and surrounding structures were obtained without intravenous contrast. COMPARISON:  None. FINDINGS: Brain: No  age-advanced or lobar predominant cerebral atrophy. Partial empty sella turcica. No cortical encephalomalacia is identified. No significant cerebral white matter disease. There is no acute infarct. No evidence of an intracranial mass. No chronic intracranial blood products. No extra-axial fluid collection. No midline shift. Vascular: Maintained flow voids within the proximal large arterial vessels. Skull and upper cervical spine: No focal worrisome marrow lesion. Sinuses/Orbits: No mass or acute finding within the imaged orbits. No significant paranasal sinus disease. IMPRESSION: 1. No evidence of an acute intracranial abnormality. 2. Partially empty sella turcica. This finding can reflect incidental anatomic variation, or alternatively, it can be associated with chronic idiopathic intracranial hypertension (pseudotumor cerebri). 3. Otherwise unremarkable non-contrast MRI appearance the brain. Electronically Signed   By: Jackey Loge D.O.   On: 10/16/2023 09:21     MRI results discussed with Dr. Otelia Limes   PROCEDURES:  Critical Care performed: No  Procedures   MEDICATIONS ORDERED IN ED: Medications  sodium chloride 0.9 % bolus 500 mL (0 mLs Intravenous Stopped 10/16/23 1110)  metoCLOPramide (REGLAN) injection 5 mg (5 mg Intravenous Given 10/16/23 0804)     IMPRESSION / MDM / ASSESSMENT AND PLAN / ED COURSE  I reviewed the triage vital signs and the nursing notes.                              Differential diagnosis includes but is not exclusive to subarachnoid hemorrhage, meningitis, encephalitis, previous head trauma, cavernous venous thrombosis, muscle tension headache, temporal arteritis, migraine or migraine equivalent, etc.  No obvious infectious cause though will check viral influenza given somewhat unknown nature.  She has no signs or symptoms of suggest acute encephalitis or meningitis.  Her headache did not come on severely or maximally it does not sound to be aneurysmal in  nature.  She does not have any risk factors for acute thrombosis and she has no temporal artery/temporal region tenderness and her temporal artery pulsations are normal bilaterally.  Given her age ESR was sent nonetheless and reassuring  No obvious acute neurologic symptoms or focal findings.  Will obtain MRI given the somewhat new nature of her headache and her age.  Wish to exclude acute central process such as subtle ischemia, mass lesion, signs of elevated ICP, etc.  Overall her exam is quite reassuring.  Discussed treatment plan with her, medication side effects including potential side effects of Reglan.  She has been treated with Zofran and reports that has caused paradoxical headache for her in the past.  She reports use  of other medicines as been helpful but does not know what they are.  Will trial Reglan, if this is not helpful potentially redose or potential Phenergan the side effect profile of both these medications increasing with age.  Patient's presentation is most consistent with acute complicated illness / injury requiring diagnostic workup.      Clinical Course as of 10/16/23 1137  Fri Oct 16, 2023  0844 Sed Rate: 26 Normal, no evidence of GCA when combined with exam findings that are low risk as well [MQ]  1038 Discussed with Dr. Otelia Limes. Reviewed history, headache, etc. He recommends outpatient ophthalmology evaluation and outpatient neurology. Recommends evaluation within 1 week by an ophthalmologist, probably this is the most conservative and a good way to evaluate for papilledema.  [MQ]    Clinical Course User Index [MQ] Sharyn Creamer, MD   ----------------------------------------- 11:35 AM on 10/16/2023 ----------------------------------------- Discussed with Dr. Sharman Crate, and appointment made at 1 PM today for patient to be seen by ophthalmology for evaluation of headache/rule out papilledema.  Dr. Sharman Crate advising well follow-up on examination.  Patient also  agreeable to follow-up with neurology.  Family at bedside, daughter driving her to 1 PM appointment home thereafter.  Patient also requesting prescription for nausea medicine she has tolerated Phenergan in the past which I will represcribe.  Discussed common side effects of both Fioricet and Phenergan and not to combine these medications within close proximity or drive while taking.  Patient very agreeable.  Return precautions and treatment recommendations and follow-up discussed with the patient who is agreeable with the plan.   FINAL CLINICAL IMPRESSION(S) / ED DIAGNOSES   Final diagnoses:  Generalized headache     Rx / DC Orders   ED Discharge Orders          Ordered    butalbital-acetaminophen-caffeine (FIORICET) 50-325-40 MG tablet  Every 8 hours PRN        10/16/23 1126    promethazine (PHENERGAN) 12.5 MG tablet  Every 8 hours PRN        10/16/23 1135             Note:  This document was prepared using Dragon voice recognition software and may include unintentional dictation errors.   Sharyn Creamer, MD 10/16/23 1137

## 2023-10-16 NOTE — ED Triage Notes (Signed)
 C/O headache and nausea x 5 days.  Patient has remote history of migraines.  Also c/o blurred vision and dizziness since Monday.  Denies fever/chills.  AAOx3.  Skin warm and dry. NAD. Ambulates with easy and steady gait. Posture upright and relaxed.  MAE equally and strong. Speech clear.

## 2023-10-16 NOTE — ED Notes (Signed)
 EDP at bedside

## 2023-10-16 NOTE — ED Notes (Signed)
 Pt to MRI at this time.

## 2023-10-16 NOTE — Discharge Instructions (Addendum)
 We recommend that you have a eye examination to look at the optic nerve and make certain there is no evidence of swelling around it today.  [facility], Dr. Estelle Grumbles, has an appointment for you today at 1 PM.  Please go to their clinic for examination and further evaluation today.  Return to the ER right away if you develop confusion, loss of vision, severe worsening of headache nausea and vomiting, numbness weakness fever or other concerns arise.  No driving today or within 8 hours of use of Fioricet

## 2024-04-08 ENCOUNTER — Other Ambulatory Visit: Payer: Self-pay | Admitting: Primary Care

## 2024-04-08 DIAGNOSIS — Z1231 Encounter for screening mammogram for malignant neoplasm of breast: Secondary | ICD-10-CM

## 2024-05-04 ENCOUNTER — Ambulatory Visit: Admitting: Internal Medicine

## 2024-05-05 ENCOUNTER — Encounter: Payer: Self-pay | Admitting: Student in an Organized Health Care Education/Training Program

## 2024-05-05 ENCOUNTER — Ambulatory Visit: Admitting: Student in an Organized Health Care Education/Training Program

## 2024-05-05 VITALS — BP 128/78 | HR 80 | Temp 97.8°F | Ht 63.0 in | Wt 266.0 lb

## 2024-05-05 DIAGNOSIS — R0602 Shortness of breath: Secondary | ICD-10-CM

## 2024-05-05 DIAGNOSIS — E669 Obesity, unspecified: Secondary | ICD-10-CM

## 2024-05-05 DIAGNOSIS — G4733 Obstructive sleep apnea (adult) (pediatric): Secondary | ICD-10-CM

## 2024-05-05 DIAGNOSIS — R911 Solitary pulmonary nodule: Secondary | ICD-10-CM | POA: Diagnosis not present

## 2024-05-05 DIAGNOSIS — R0683 Snoring: Secondary | ICD-10-CM | POA: Diagnosis not present

## 2024-05-05 DIAGNOSIS — Z87891 Personal history of nicotine dependence: Secondary | ICD-10-CM

## 2024-05-05 NOTE — Progress Notes (Signed)
 Assessment & Plan:   #Shortness of breath #Exertional dyspnea    Chronic exertional dyspnea has worsened over the past year, presenting as shortness of breath during exertion without chest pain, tightness, or significant wheezing. Differential diagnoses include COPD, restrictive lung disease due to obesity, and potential cardiac causes. She has a 45-year smoking history, having quit 3.5 years ago. A previous lung nodule was noted on x-ray. Will order a pulmonary function test to evaluate for COPD or restrictive lung disease. A chest CT is necessary to assess lung tissue health and evaluate for nodules or scar tissue. She has an upcoming appointment with cardiology for an evaluation, will hold off on ordering an echocardiogram at this point until she has been seen by cardiology.   - Pulmonary Function Test; Future  #Obesity contributing to dyspnea    A recent weight gain of 34 pounds over 6 months may contribute to dyspnea by restricting lung expansion. Discussed the impact of weight on respiratory function and emphasized the importance of weight management.  #Pulmonary nodule under surveillance    A previous lung nodule identified on x-ray per patient report. Order a chest CT to evaluate the current status of the lung nodule.  - CT CHEST WO CONTRAST; Future  #Suspected obstructive sleep apnea    Snoring is reported, with no witnessed apneas. There is an increased risk due to obesity, crowded oropharynx, and a history of snoring. Discussed the potential contribution of sleep apnea to overall symptoms, though it is not likely the sole cause of dyspnea. Will order a home sleep study to evaluate for obstructive sleep apnea.   - Home sleep test; Future   Return in about 6 weeks (around 06/16/2024).  Chloe November, MD [facility]  I spent 60 minutes caring for this patient today, including preparing to see the patient, obtaining a medical history , reviewing a  separately obtained history, performing a medically appropriate examination and/or evaluation, counseling and educating the patient/family/caregiver, ordering medications, tests, or procedures, documenting clinical information in the electronic health record, and independently interpreting results (not separately reported/billed) and communicating results to the patient/family/caregiver  End of visit medications:  No orders of the defined types were placed in this encounter.    Current Outpatient Medications:    albuterol  (VENTOLIN  HFA) 108 (90 Base) MCG/ACT inhaler, Inhale 2 puffs into the lungs every 4 (four) hours., Disp: , Rfl:    cyclobenzaprine (FLEXERIL) 10 MG tablet, Take 10 mg by mouth 2 (two) times daily as needed., Disp: , Rfl:    ibuprofen (ADVIL) 800 MG tablet, Take 800 mg by mouth 3 (three) times daily. (Patient taking differently: Take 800 mg by mouth every 8 (eight) hours as needed.), Disp: , Rfl:    promethazine  (PHENERGAN ) 12.5 MG tablet, Take 1 tablet (12.5 mg total) by mouth every 8 (eight) hours as needed for nausea or vomiting., Disp: 30 tablet, Rfl: 0   Subjective:   PATIENT ID: Chloe Farley GENDER: female DOB: August 07, 1960, MRN: [id number]  Chief Complaint  Patient presents with   Shortness of Breath    SOB for 2 years and gradually getting worse. No wheezing or cough. Did try Qvar inhaler and it helped with her breathing but made her cough so she is not using it.  Albuterol      HPI  Discussed the use of AI scribe software for clinical note transcription with the patient, who gave verbal consent to proceed.  Chloe Farley is a 63 year old female who  presents with worsening shortness of breath.  She has experienced shortness of breath for the past two years, with a notable worsening over the last year. Initially, she managed daily activities, but now she feels breathless with minimal exertion, such as climbing stairs or walking long distances. Episodes of feeling like  she might collapse occur, particularly when anxious or rushed. No shortness of breath at rest.  She uses inhaled corticosteroids (Qvar) and albuterol  with minimal improvement. Qvar initially helped but caused a dry cough, leading to its discontinuation. No chest pain, chest tightness, or productive cough. She occasionally hears a whistling sound but her doctors have never heard a wheeze.  Her medical history includes a severe H1N1 flu infection in 2017, requiring oxygen therapy for four months. She smoked a pack a day for approximately 45 years, quitting three and a half years ago. She has gained 34 pounds over the past six months, which she feels has exacerbated her breathing difficulties.  A lung nodule was identified on an x-ray years ago, monitored but not deemed significant. She has not been diagnosed with COPD, although it was considered in the past.   She experiences occasional ankle swelling but denies significant dietary salt intake. She snores at night but has not been diagnosed with sleep apnea. Her social history includes managing gas stations, a pharmacy, and a liquor store, and caring for her husband and grandchildren. Her husband is deceased, and her grandchildren are now grown.   Ancillary information including prior medications, full medical/surgical/family/social histories, and PFTs (when available) are listed below and have been reviewed.    Review of Systems  Constitutional:  Negative for chills, fever, malaise/fatigue and weight loss (weight gain).  Respiratory:  Positive for shortness of breath. Negative for cough, hemoptysis, sputum production and wheezing.   Cardiovascular:  Negative for chest pain.     Objective:   Vitals:   05/05/24 0844  BP: 128/78  Pulse: 80  Temp: 97.8 F (36.6 C)  SpO2: 96%  Weight: 266 lb (120.7 kg)  Height: 5' 3 (1.6 m)   96% on RA BMI Readings from Last 3 Encounters:  05/05/24 47.12 kg/m  10/16/23 41.43 kg/m  06/15/21 41.43  kg/m   Wt Readings from Last 3 Encounters:  05/05/24 266 lb (120.7 kg)  10/16/23 233 lb 14.5 oz (106.1 kg)  06/15/21 233 lb 14.5 oz (106.1 kg)    Physical Exam Constitutional:      Appearance: She is obese.  HENT:     Mouth/Throat:     Mouth: Mucous membranes are moist.     Comments: Crowded oropharynx Neck:     Comments: Enlarged neck circumference Cardiovascular:     Rate and Rhythm: Normal rate and regular rhythm.     Pulses: Normal pulses.     Heart sounds: Normal heart sounds.  Pulmonary:     Effort: Pulmonary effort is normal.     Breath sounds: Normal breath sounds. No wheezing, rhonchi or rales.  Musculoskeletal:     Right lower leg: No edema.     Left lower leg: No edema.  Neurological:     General: No focal deficit present.     Mental Status: She is alert and oriented to person, place, and time. Mental status is at baseline.       Ancillary Information    Past Medical History:  Diagnosis Date   Dyspnea    When anxious   Hyperlipidemia    Osteoarthritis of both knees    Vitamin D  deficiency      Family History  Problem Relation Age of Onset   Cancer Mother    Hyperlipidemia Father    Heart attack Father    Hypertension Sister    Hyperlipidemia Sister    Head & neck cancer Brother      Past Surgical History:  Procedure Laterality Date   TOTAL HIP ARTHROPLASTY     right & left    TUBAL LIGATION  1985    Social History   Socioeconomic History   Marital status: Widowed    Spouse name: Not on file   Number of children: Not on file   Years of education: Not on file   Highest education level: Not on file  Occupational History   Not on file  Tobacco Use   Smoking status: Every Day    Current packs/day: 0.00    Average packs/day: 0.3 packs/day for 15.0 years (3.8 ttl pk-yrs)    Types: Cigarettes    Start date: 04/13/2005    Last attempt to quit: 04/13/2020    Years since quitting: 4.0   Smokeless tobacco: Never   Tobacco comments:     Quit smoking in 2021    Started smoking at 63 years old    Smoked 2PPD at her heaviest  Vaping Use   Vaping status: Never Used  Substance and Sexual Activity   Alcohol use: Never   Drug use: Never   Sexual activity: Not on file  Other Topics Concern   Not on file  Social History Narrative   Not on file   Social Drivers of Health   Financial Resource Strain: Not on file  Food Insecurity: Not on file  Transportation Needs: Not on file  Physical Activity: Not on file  Stress: Not on file  Social Connections: Not on file  Intimate Partner Violence: Not on file     Allergies  Allergen Reactions   Rofecoxib Other (See Comments)    Combination of depakote & vioxx caused fluid retention in lungs and surrounding the heart   Sulfa Antibiotics Anaphylaxis, Anxiety, Hives, Hypertension, Itching, Palpitations, Rash, Shortness Of Breath and Swelling   Tramadol Nausea Only   Depakote [Divalproex Sodium] Other (See Comments)    Combination of depakote & vioxx caused fluid retention in lungs and surrounding the heart    Meloxicam Other (See Comments)    Vaginal bleeding   Zofran  [Ondansetron  Hcl] Other (See Comments)    Severe headaches/migraines     CBC    Component Value Date/Time   WBC 8.5 10/16/2023 0750   RBC 4.64 10/16/2023 0750   HGB 14.2 10/16/2023 0750   HCT 41.8 10/16/2023 0750   PLT 256 10/16/2023 0750   MCV 90.1 10/16/2023 0750   MCH 30.6 10/16/2023 0750   MCHC 34.0 10/16/2023 0750   RDW 12.9 10/16/2023 0750   LYMPHSABS 1.9 06/06/2021 0446   MONOABS 0.5 06/06/2021 0446   EOSABS 0.1 06/06/2021 0446   BASOSABS 0.0 06/06/2021 0446    Pulmonary Functions Testing Results:     No data to display          Outpatient Medications Prior to Visit  Medication Sig Dispense Refill   albuterol  (VENTOLIN  HFA) 108 (90 Base) MCG/ACT inhaler Inhale 2 puffs into the lungs every 4 (four) hours.     cyclobenzaprine (FLEXERIL) 10 MG tablet Take 10 mg by mouth 2 (two) times  daily as needed.     ibuprofen (ADVIL) 800 MG tablet Take 800 mg by mouth 3 (  three) times daily. (Patient taking differently: Take 800 mg by mouth every 8 (eight) hours as needed.)     promethazine  (PHENERGAN ) 12.5 MG tablet Take 1 tablet (12.5 mg total) by mouth every 8 (eight) hours as needed for nausea or vomiting. 30 tablet 0   butalbital -acetaminophen -caffeine  (FIORICET) 50-325-40 MG tablet Take 1-2 tablets by mouth every 8 (eight) hours as needed for headache. Do not drive within 8 hour of use. 16 tablet 0   No facility-administered medications prior to visit.

## 2024-05-05 NOTE — Patient Instructions (Signed)
  VISIT SUMMARY: Today, we discussed your worsening shortness of breath, which has been troubling you for the past two years and has become more severe over the last year. We reviewed your medical history, including your past severe flu infection, smoking history, and recent weight gain. We also talked about your occasional ankle swelling and snoring at night.  YOUR PLAN: -EXERTIONAL DYSPNEA: Exertional dyspnea means experiencing shortness of breath during physical activity. We will conduct a pulmonary function test to check for COPD or restrictive lung disease and a chest CT to assess your lung tissue and any nodules or scar tissue. Additionally, we will coordinate with a cardiologist for a heart evaluation, which may include an echocardiogram.  -OBESITY CONTRIBUTING TO DYSPNEA: Your recent weight gain may be making it harder for your lungs to expand, contributing to your shortness of breath. We discussed how important it is to manage your weight to help improve your breathing.  -PULMONARY NODULE UNDER SURVEILLANCE: A lung nodule is a small growth in the lung. We will order a chest CT to check the current status of the nodule previously identified on your x-ray.  -SUSPECTED OBSTRUCTIVE SLEEP APNEA: Obstructive sleep apnea is a condition where breathing stops and starts during sleep. Given your snoring and weight gain, we will order a home sleep study to see if you have this condition.  INSTRUCTIONS: Please schedule the pulmonary function test, chest CT, and home sleep study as soon as possible. We will also arrange for you to see a cardiologist for a heart evaluation. Follow up with us  after completing these tests to discuss the results and next steps.

## 2024-05-09 ENCOUNTER — Ambulatory Visit
Admission: RE | Admit: 2024-05-09 | Discharge: 2024-05-09 | Disposition: A | Discharge status: Home / Self Care | Source: Ambulatory Visit | Attending: Student in an Organized Health Care Education/Training Program | Admitting: Student in an Organized Health Care Education/Training Program

## 2024-05-09 DIAGNOSIS — R911 Solitary pulmonary nodule: Secondary | ICD-10-CM | POA: Diagnosis present

## 2024-05-11 ENCOUNTER — Ambulatory Visit: Payer: Self-pay | Admitting: Student in an Organized Health Care Education/Training Program

## 2024-05-16 ENCOUNTER — Ambulatory Visit
Admission: RE | Admit: 2024-05-16 | Discharge: 2024-05-16 | Disposition: A | Discharge status: Home / Self Care | Source: Ambulatory Visit | Attending: Primary Care | Admitting: Primary Care

## 2024-05-16 DIAGNOSIS — Z1231 Encounter for screening mammogram for malignant neoplasm of breast: Secondary | ICD-10-CM | POA: Diagnosis present

## 2024-05-16 NOTE — Progress Notes (Deleted)
  Cardiology Office Note   Date:  05/16/2024  ID:  Chloe Farley, DOB January 17, 1961, MRN [id number] PCP: Era Raisin, NP  [facility] Providers Cardiologist:  None { Click to update primary MD,subspecialty MD or APP then REFRESH:1}    History of Present Illness Chloe Farley is a 63 y.o. female PMH obesity who presents for further evaluation management of DOE.  ***.  Last LDL 201 08/2019.  Relevant CVD History -CT chest 04/2024 severe aortic atherosclerosis, multivessel coronary artery calcium, aortic valve calcifications, and possible stent in Lcx? -TTE 09/2019 LVEF 60 to 65% with grade 1 diastolic dysfunction, mildly elevated PASP, no significant valvular disease -Essentially normal carotid Dopplers 07/2019   ROS: Pt denies any chest discomfort, jaw pain, arm pain, palpitations, syncope, presyncope, orthopnea, PND, or LE edema.  Studies Reviewed I have independently reviewed the patient's ECG, ***.  Physical Exam VS:  There were no vitals taken for this visit.       Wt Readings from Last 3 Encounters:  05/05/24 266 lb (120.7 kg)  10/16/23 233 lb 14.5 oz (106.1 kg)  06/15/21 233 lb 14.5 oz (106.1 kg)    GEN: No acute distress. NECK: No JVD; No carotid bruits. CARDIAC: ***RRR, no murmurs, rubs, gallops. RESPIRATORY:  Clear to auscultation. EXTREMITIES:  Warm and well-perfused. No edema.  ASSESSMENT AND PLAN DOE CAC Possible prior LCx PCI? Aortic atherosclerosis HLD        {Are you ordering a CV Procedure (e.g. stress test, cath, DCCV, TEE, etc)?   Press F2        :[id number]}  Dispo: ***  Signed, Caron Poser, MD

## 2024-05-17 ENCOUNTER — Ambulatory Visit

## 2024-05-25 NOTE — Progress Notes (Signed)
 Cardiology Office Note   Date:  05/26/2024  ID:  Chloe Farley, DOB 1961-03-30, MRN [id number] PCP: Era Raisin, NP  [facility] Providers Cardiologist:  Caron Poser, MD      History of Present Illness Chloe Farley is a 63 y.o. female PMH obesity who presents for further evaluation management of DOE.  Patient notes a 1 to 2-year history of DOE which is gotten worse recently.  She notes that she has a lot of stairs in her home and sometimes has to stop on the way up due to dyspnea.  She is currently seeing pulmonology for this issue.  She also notes that she has gained a significant amount of weight recently, approximately 30 pounds.  Denies chest discomfort.  Denies orthopnea.  Denies syncope or palpitations.  Last LDL 201 08/2019.  Relevant CVD History -CT chest 04/2024 severe aortic atherosclerosis, multivessel coronary artery calcium, aortic valve calcifications -TTE 09/2019 LVEF 60 to 65% with grade 1 diastolic dysfunction, mildly elevated PASP, no significant valvular disease -Essentially normal carotid Dopplers 07/2019   ROS: Pt denies any chest discomfort, jaw pain, arm pain, palpitations, syncope, presyncope, orthopnea, PND, or LE edema.  Studies Reviewed I have independently reviewed the patient's ECG, recent CT scan, previous cardiac testing, recent blood work.  Physical Exam VS:  BP 128/80 (BP Location: Left Arm, Patient Position: Sitting, Cuff Size: Large)   Pulse 80   Ht 5' 3 (1.6 m)   Wt 266 lb (120.7 kg)   SpO2 95%   BMI 47.12 kg/m        Wt Readings from Last 3 Encounters:  05/26/24 266 lb (120.7 kg)  05/05/24 266 lb (120.7 kg)  10/16/23 233 lb 14.5 oz (106.1 kg)    GEN: No acute distress. NECK: No JVD; No carotid bruits. CARDIAC: RRR, no murmurs, rubs, gallops. RESPIRATORY:  Clear to auscultation. EXTREMITIES:  Warm and well-perfused. No edema.  ASSESSMENT AND PLAN DOE Morbid obesity Patient presents with undifferentiated dyspnea.  She is  currently seeing pulmonology for this issue.  She has had significant weight gain recently.  Seems most consistent with HFpEF, but we do need to rule out other cardiac etiologies as well.  I discussed empirically starting Farxiga for HFpEF - she is not interested in doing this at this time.  She notes significant hesitancy with the medications.  After shared decision making discussion, we settled on doing cardiac testing first before initiating any empiric treatments.  Plan: - Echocardiogram - Stress PET given severe coronary calcium burden - Start ASA 81 mg daily - Will hold off on Farxiga for empiric HFpEF treatment per patient request - I recommended referral to our staff pharmacist to discuss injectable weight loss medications; she politely declined at this time - Further plans pending results  CAC Aortic atherosclerosis HLD Statin intolerance Patient has severe three-vessel CAC and aortic atherosclerosis from a recent CT scan.  She also has a very high LDL cholesterol which was 798 on last check in 2021.  She has a history of statin intolerance.  In addition to ASA, I recommended that we start Repatha since this level of LDL elevation puts her at high risk for cardiovascular events.  She notes that she will think about this and get back to me.  We will check a lipid panel to get a new baseline.     Informed Consent   The risks [chest pain, shortness of breath, cardiac arrhythmias, dizziness, blood pressure fluctuations, myocardial infarction, stroke/transient ischemic attack, nausea, vomiting,  allergic reaction, radiation exposure, metallic taste sensation and life-threatening complications (estimated to be 1 in 10,000)], benefits (risk stratification, diagnosing coronary artery disease, treatment guidance) and alternatives of a cardiac PET stress test were discussed in detail with Ms. Marts and she agrees to proceed.     Dispo: RTC 3 months to review cardiac testing results and to continue  discussion regarding medications  Signed, Caron Poser, MD

## 2024-05-26 ENCOUNTER — Ambulatory Visit

## 2024-05-26 VITALS — BP 128/80 | HR 80 | Ht 63.0 in | Wt 266.0 lb

## 2024-05-26 DIAGNOSIS — R0609 Other forms of dyspnea: Secondary | ICD-10-CM

## 2024-05-26 DIAGNOSIS — Z789 Other specified health status: Secondary | ICD-10-CM

## 2024-05-26 DIAGNOSIS — I700 Atherosclerosis of aorta: Secondary | ICD-10-CM | POA: Diagnosis not present

## 2024-05-26 DIAGNOSIS — E782 Mixed hyperlipidemia: Secondary | ICD-10-CM

## 2024-05-26 DIAGNOSIS — I2510 Atherosclerotic heart disease of native coronary artery without angina pectoris: Secondary | ICD-10-CM

## 2024-05-26 MED ORDER — ASPIRIN 81 MG PO TBEC: 81.0000 mg | DELAYED_RELEASE_TABLET | Freq: Every day | ORAL | Status: DC

## 2024-05-26 NOTE — Patient Instructions (Signed)
 Medication Instructions:  Your physician recommends the following medication changes.  START TAKING: Aspirin  81 mg by mouth once a day  *If you need a refill on your cardiac medications before your next appointment, please call your pharmacy*  Lab Work: Your provider would like for you to have following labs drawn today LIPID PANEL.   If you have labs (blood work) drawn today and your tests are completely normal, you will receive your results only by: MyChart Message (if you have MyChart) OR A paper copy in the mail If you have any lab test that is abnormal or we need to change your treatment, we will call you to review the results.  Testing/Procedures: Your physician has requested that you have an echocardiogram. Echocardiography is a painless test that uses sound waves to create images of your heart. It provides your doctor with information about the size and shape of your heart and how well your heart's chambers and valves are working.   You may receive an ultrasound enhancing agent through an IV if needed to better visualize your heart during the echo. This procedure takes approximately one hour.  There are no restrictions for this procedure.  This will take place at [address] [facility]) [address], [place] [address]  Please note: We ask at that you not bring children with you during ultrasound (echo/ vascular) testing. Due to room size and safety concerns, children are not allowed in the ultrasound rooms during exams. Our front office staff cannot provide observation of children in our lobby area while testing is being conducted. An adult accompanying a patient to their appointment will only be allowed in the ultrasound room at the discretion of the ultrasound technician under special circumstances. We apologize for any inconvenience.   CARDIAC PET SCAN:  Please report to Radiology at [facility], medical mall, 30 mins prior to your  test.  [address]  [place], [place]  How to Prepare for Your Cardiac PET/CT Stress Test:  Nothing to eat or drink, except water, 3 hours prior to arrival time.  NO caffeine /decaffeinated products, or chocolate 12 hours prior to arrival. (Please note decaffeinated beverages (teas/coffees) still contain caffeine ).  If you have caffeine  within 12 hours prior, the test will need to be rescheduled.  Medication instructions: You may take your daily medications with water.  NO perfume, cologne or lotion on chest or abdomen area. FEMALES - Please avoid wearing dresses to this appointment.  Total time is 1 to 2 hours; you may want to bring reading material for the waiting time.  In preparation for your appointment, medication and supplies will be purchased.  Appointment availability is limited, so if you need to cancel or reschedule, please call the Radiology Department Scheduler at [phone] 24 hours in advance to avoid a cancellation fee of $100.00  What to Expect When you Arrive:  Once you arrive and check in for your appointment, you will be taken to a preparation room within the Radiology Department.  A technologist or Nurse will obtain your medical history, verify that you are correctly prepped for the exam, and explain the procedure.  Afterwards, an IV will be started in your arm and electrodes will be placed on your skin for EKG monitoring during the stress portion of the exam. Then you will be escorted to the PET/CT scanner.  There, staff will get you positioned on the scanner and obtain a blood pressure and EKG.  During the exam, you  will continue to be connected to the EKG and blood pressure machines.  A small, safe amount of a radioactive tracer will be injected in your IV to obtain a series of pictures of your heart along with an injection of a stress agent.    After your Exam:  It is recommended that you eat a meal and drink a caffeinated beverage to counter act any effects  of the stress agent.  Drink plenty of fluids for the remainder of the day and urinate frequently for the first couple of hours after the exam.  Your doctor will inform you of your test results within 7-10 business days.  For more information and frequently asked questions, please visit our website: [email or web address]  For questions about your test or how to prepare for your test, please call: Cardiac Imaging Nurse Navigators Office: [phone]   Follow-Up: At [facility], you and your health needs are our priority.  As part of our continuing mission to provide you with exceptional heart care, our providers are all part of one team.  This team includes your primary Cardiologist (physician) and Advanced Practice Providers or APPs (Physician Assistants and Nurse Practitioners) who all work together to provide you with the care you need, when you need it.  Your next appointment:  3 month(s)  Provider:  Caron Poser, MD    We recommend signing up for the patient portal called MyChart.  Sign up information is provided on this After Visit Summary.  MyChart is used to connect with patients for Virtual Visits (Telemedicine).  Patients are able to view lab/test results, encounter notes, upcoming appointments, etc.  Non-urgent messages can be sent to your provider as well.   To learn more about what you can do with MyChart, go to [email or web address].

## 2024-05-27 ENCOUNTER — Ambulatory Visit: Payer: Self-pay

## 2024-05-27 LAB — LIPID PANEL
Chol/HDL Ratio: 4.2 ratio (ref 0.0–4.4)
Cholesterol, Total: 268 mg/dL — ABNORMAL HIGH (ref 100–199)
HDL: 64 mg/dL (ref >39)
LDL Chol Calc (NIH): 183 mg/dL — ABNORMAL HIGH (ref 0–99)
Triglycerides: 119 mg/dL (ref 0–149)
VLDL Cholesterol Cal: 21 mg/dL (ref 5–40)

## 2024-05-27 NOTE — Addendum Note (Signed)
 Addended by: HARL HERON DEL on: 05/27/2024 11:07 AM   Modules accepted: Orders

## 2024-05-31 ENCOUNTER — Encounter (HOSPITAL_COMMUNITY): Payer: Self-pay

## 2024-06-02 ENCOUNTER — Ambulatory Visit
Admission: RE | Admit: 2024-06-02 | Discharge: 2024-06-02 | Disposition: A | Discharge status: Home / Self Care | Source: Ambulatory Visit

## 2024-06-02 ENCOUNTER — Other Ambulatory Visit: Payer: Self-pay

## 2024-06-02 DIAGNOSIS — I2510 Atherosclerotic heart disease of native coronary artery without angina pectoris: Secondary | ICD-10-CM | POA: Diagnosis present

## 2024-06-02 DIAGNOSIS — R0609 Other forms of dyspnea: Secondary | ICD-10-CM | POA: Insufficient documentation

## 2024-06-02 MED ORDER — REGADENOSON 0.4 MG/5ML IV SOLN
INTRAVENOUS | Status: AC
  Filled 2024-06-02: qty 5

## 2024-06-02 MED ORDER — REGADENOSON 0.4 MG/5ML IV SOLN
0.4000 mg | Freq: Once | INTRAVENOUS | Status: AC
  Administered 2024-06-02: 0.4 mg via INTRAVENOUS
  Filled 2024-06-02: qty 5

## 2024-06-02 MED ORDER — RUBIDIUM RB82 GENERATOR (RUBYFILL)
25.0000 | PACK | Freq: Once | INTRAVENOUS | Status: AC
  Administered 2024-06-02: 24.96 via INTRAVENOUS

## 2024-06-02 MED ORDER — RUBIDIUM RB82 GENERATOR (RUBYFILL)
25.0000 | PACK | Freq: Once | INTRAVENOUS | Status: AC
  Administered 2024-06-02: 24.97 via INTRAVENOUS

## 2024-06-03 LAB — NM PET CT CARDIAC PERFUSION MULTI W/ABSOLUTE BLOODFLOW
MBFR: 2.78
Nuc Rest EF: 72 %
Nuc Stress EF: 73 %
Peak HR: 110 {beats}/min
Rest HR: 92 {beats}/min
Rest MBF: 0.9 ml/g/min
Rest Nuclear Isotope Dose: 25 mCi
SRS: 0
SSS: 1
ST Depression (mm): 0 mm
Stress MBF: 2.5 ml/g/min
Stress Nuclear Isotope Dose: 25 mCi
TID: 1.2

## 2024-06-05 ENCOUNTER — Encounter

## 2024-06-14 DIAGNOSIS — G4733 Obstructive sleep apnea (adult) (pediatric): Secondary | ICD-10-CM | POA: Diagnosis not present

## 2024-06-20 ENCOUNTER — Ambulatory Visit

## 2024-06-20 DIAGNOSIS — I2510 Atherosclerotic heart disease of native coronary artery without angina pectoris: Secondary | ICD-10-CM

## 2024-06-20 DIAGNOSIS — R0609 Other forms of dyspnea: Secondary | ICD-10-CM

## 2024-06-20 LAB — ECHOCARDIOGRAM COMPLETE
AR max vel: 1.7 cm2
AV Area VTI: 1.64 cm2
AV Area mean vel: 1.63 cm2
AV Mean grad: 5.7 mmHg
AV Peak grad: 9.9 mmHg
Ao pk vel: 1.58 m/s
Area-P 1/2: 3.21 cm2
P 1/2 time: 317 ms
S' Lateral: 2.7 cm

## 2024-06-29 ENCOUNTER — Ambulatory Visit: Admitting: Student in an Organized Health Care Education/Training Program

## 2024-06-29 ENCOUNTER — Encounter: Payer: Self-pay | Admitting: Student in an Organized Health Care Education/Training Program

## 2024-06-29 ENCOUNTER — Ambulatory Visit

## 2024-06-29 VITALS — BP 130/80 | HR 91 | Temp 97.7°F | Ht 63.0 in | Wt 270.0 lb

## 2024-06-29 DIAGNOSIS — Z87891 Personal history of nicotine dependence: Secondary | ICD-10-CM

## 2024-06-29 DIAGNOSIS — G4733 Obstructive sleep apnea (adult) (pediatric): Secondary | ICD-10-CM

## 2024-06-29 DIAGNOSIS — R918 Other nonspecific abnormal finding of lung field: Secondary | ICD-10-CM | POA: Diagnosis not present

## 2024-06-29 DIAGNOSIS — Z6841 Body Mass Index (BMI) 40.0 and over, adult: Secondary | ICD-10-CM

## 2024-06-29 DIAGNOSIS — E669 Obesity, unspecified: Secondary | ICD-10-CM

## 2024-06-29 DIAGNOSIS — R0602 Shortness of breath: Secondary | ICD-10-CM | POA: Diagnosis not present

## 2024-06-29 DIAGNOSIS — J439 Emphysema, unspecified: Secondary | ICD-10-CM | POA: Diagnosis not present

## 2024-06-29 DIAGNOSIS — R0609 Other forms of dyspnea: Secondary | ICD-10-CM | POA: Diagnosis not present

## 2024-06-29 DIAGNOSIS — J449 Chronic obstructive pulmonary disease, unspecified: Secondary | ICD-10-CM

## 2024-06-29 DIAGNOSIS — R911 Solitary pulmonary nodule: Secondary | ICD-10-CM

## 2024-06-29 LAB — PULMONARY FUNCTION TEST
DL/VA % pred: 70 %
DL/VA: 2.99 ml/min/mmHg/L
DLCO unc % pred: 62 %
DLCO unc: 11.91 ml/min/mmHg
FEF 25-75 Post: 0.86 L/s
FEF 25-75 Pre: 0.75 L/s
FEF2575-%Change-Post: 14 %
FEF2575-%Pred-Post: 41 %
FEF2575-%Pred-Pre: 35 %
FEV1-%Change-Post: 3 %
FEV1-%Pred-Post: 62 %
FEV1-%Pred-Pre: 60 %
FEV1-Post: 1.44 L
FEV1-Pre: 1.4 L
FEV1FVC-%Change-Post: 4 %
FEV1FVC-%Pred-Pre: 86 %
FEV6-%Change-Post: 0 %
FEV6-%Pred-Post: 70 %
FEV6-%Pred-Pre: 71 %
FEV6-Post: 2.06 L
FEV6-Pre: 2.08 L
FEV6FVC-%Change-Post: 0 %
FEV6FVC-%Pred-Post: 103 %
FEV6FVC-%Pred-Pre: 103 %
FVC-%Change-Post: 0 %
FVC-%Pred-Post: 68 %
FVC-%Pred-Pre: 69 %
FVC-Post: 2.08 L
FVC-Pre: 2.1 L
Post FEV1/FVC ratio: 69 %
Post FEV6/FVC ratio: 99 %
Pre FEV1/FVC ratio: 66 %
Pre FEV6/FVC Ratio: 99 %
RV % pred: 152 %
RV: 3.12 L
TLC % pred: 107 %
TLC: 5.29 L

## 2024-06-29 MED ORDER — UMECLIDINIUM-VILANTEROL 62.5-25 MCG/ACT IN AEPB: 1.0000 | INHALATION_SPRAY | Freq: Every day | RESPIRATORY_TRACT | 11 refills | Status: AC

## 2024-06-29 NOTE — Progress Notes (Signed)
 Full PFT completed today ? ?

## 2024-06-29 NOTE — Progress Notes (Unsigned)
 Assessment & Plan:   Assessment & Plan  #Chronic obstructive pulmonary disease with emphysema #Exertional Dyspnea  CT scan shows emphysema with stable pulmonary nodules. PFTs indicate obstructive defect and decreased DLCO due to emphysema and heart failure. Shortness of breath is multifactorial from COPD, heart failure, and sleep apnea. Given smoking history (45 pack years) and symptoms, will initiate LABA/LAMA therapy with Anoro Ellipta .  - Prescribed Anoro Ellipta , one puff daily, pending insurance coverage. - Advised to contact pharmacy for alternatives if Anoro Ellipta  is not covered. - umeclidinium-vilanterol (ANORO ELLIPTA ) 62.5-25 MCG/ACT AEPB; Inhale 1 puff into the lungs daily.  Dispense: 28 each; Refill: 11  #Obstructive sleep apnea, moderate  Sleep study confirms moderate obstructive sleep apnea. Treatment options include CPAP and weight loss medication. Tirzepatide may aid in weight loss, potentially reducing CPAP need.  - Referred to sleep specialist for CPAP and tirzepatide discussion for weight loss.  #Obesity  Contributing to sleep apnea and possibly heart failure. Weight loss recommended to improve health and reduce symptoms.  - Discussed weight loss medication options with sleep specialist, including tirzepatide.  #Calcified Granulomas   A previous lung nodule identified on x-ray per patient report. Chest CT showed multiple calcified pulmonary nodules that are compatible with benign granulomas and consistent with a previously healed infection (?blasto vs histo). She has a lobulated mass along the paravertebral border of the right lower lobe that has been stable since 2022 and is most likely benign. No further follow up imaging is required, though she could be a candidate for lung cancer screening. Will place a referral.  -Amb referral to lung cancer screening   Return in about 6 months (around 12/28/2024).  Chloe November, MD [facility]  I  spent 33 minutes caring for this patient today, including preparing to see the patient, obtaining a medical history , reviewing a separately obtained history, performing a medically appropriate examination and/or evaluation, counseling and educating the patient/family/caregiver, ordering medications, tests, or procedures, documenting clinical information in the electronic health record, and independently interpreting results (not separately reported/billed) and communicating results to the patient/family/caregiver  End of visit medications:  Meds ordered this encounter  Medications   umeclidinium-vilanterol (ANORO ELLIPTA ) 62.5-25 MCG/ACT AEPB    Sig: Inhale 1 puff into the lungs daily.    Dispense:  28 each    Refill:  11    Current Medications[1]   Subjective:   PATIENT ID: Chloe Farley GENDER: female DOB: 06/30/1961, MRN: [id number]  Chief Complaint  Patient presents with   Shortness of Breath    Shortness of breath on exertion and occasional   at rest. Using albuterol  1-2 times a week.    Sleep Apnea    Completed HST on 06/05/2024.    HPI  Discussed the use of AI scribe software for clinical note transcription with the patient, who gave verbal consent to proceed.  History of Present Illness  Chloe Farley is a 63 year old female with COPD and heart failure who presents with shortness of breath.  Initial Visit 05/05/2024:  She has experienced shortness of breath for the past two years, with a notable worsening over the last year. Initially, she managed daily activities, but now she feels breathless with minimal exertion, such as climbing stairs or walking long distances. Episodes of feeling like she might collapse occur, particularly when anxious or rushed. No shortness of breath at rest.   She uses inhaled corticosteroids (Qvar) and albuterol  with minimal improvement. Qvar initially helped but caused  a dry cough, leading to its discontinuation. No chest pain, chest tightness, or  productive cough. She occasionally hears a whistling sound but her doctors have never heard a wheeze.   Her medical history includes a severe H1N1 flu infection in 2017, requiring oxygen therapy for four months. She smoked a pack a day for approximately 45 years, quitting three and a half years ago. She has gained 34 pounds over the past six months, which she feels has exacerbated her breathing difficulties.   A lung nodule was identified on an x-ray years ago, monitored but not deemed significant. She has not been diagnosed with COPD, although it was considered in the past.    She experiences occasional ankle swelling but denies significant dietary salt intake. She snores at night but has not been diagnosed with sleep apnea. Her social history includes managing gas stations, a pharmacy, and a liquor store, and caring for her husband and grandchildren. Her husband is deceased, and her grandchildren are now grown.   Return Visit 06/29/2024:  She has undergone extensive testing, including a cardiac evaluation with a stress test, which indicated heart failure with preserved ejection fraction. Pulmonary function tests revealed an obstructive defect consistent with COPD and a drop in DLCO. A CT scan of the chest showed emphysema and stable pulmonary nodules, with no acute findings. A sleep study was positive for moderate sleep apnea.  She feels discouraged due to weight issues, which she associates with a temptation to resume smoking, describing it as a 'daily struggle.'   Her social history includes managing gas stations, a pharmacy, and a liquor store, and caring for her husband and grandchildren. Her husband is deceased, and her grandchildren are now grown. She quit smoking in 2022.   Ancillary information including prior medications, full medical/surgical/family/social histories, and PFTs (when available) are listed below and have been reviewed.   Review of Systems  Constitutional:  Negative for  chills, fever and weight loss.  Respiratory:  Positive for shortness of breath. Negative for cough, hemoptysis, sputum production and wheezing.   Cardiovascular:  Negative for chest pain.     Objective:   Vitals:   06/29/24 1033  BP: 130/80  Pulse: 91  Temp: 97.7 F (36.5 C)  TempSrc: Temporal  SpO2: 95%  Weight: 270 lb (122.5 kg)  Height: 5' 3 (1.6 m)   95% on RA BMI Readings from Last 3 Encounters:  06/29/24 47.83 kg/m  06/29/24 47.83 kg/m  05/26/24 47.12 kg/m   Wt Readings from Last 3 Encounters:  06/29/24 270 lb (122.5 kg)  06/29/24 270 lb (122.5 kg)  05/26/24 266 lb (120.7 kg)    .vitalsmbmi  Physical Exam Constitutional:      Appearance: She is obese.  HENT:     Mouth/Throat:     Mouth: Mucous membranes are moist.     Comments: Crowded oropharynx Neck:     Comments: Enlarged neck circumference Cardiovascular:     Rate and Rhythm: Normal rate and regular rhythm.     Pulses: Normal pulses.     Heart sounds: Normal heart sounds.  Pulmonary:     Effort: Pulmonary effort is normal.     Breath sounds: Normal breath sounds. No wheezing or rales.  Abdominal:     General: There is distension.     Palpations: Abdomen is soft.  Neurological:     General: No focal deficit present.     Mental Status: She is alert and oriented to person, place, and time. Mental status is  at baseline.       Ancillary Information    Past Medical History:  Diagnosis Date   Dyspnea    When anxious   Hyperlipidemia    Osteoarthritis of both knees    Vitamin D deficiency      Family History  Problem Relation Age of Onset   Cancer Mother    Hyperlipidemia Father    Heart attack Father    Hypertension Sister    Hyperlipidemia Sister    Head & neck cancer Brother      Past Surgical History:  Procedure Laterality Date   TOTAL HIP ARTHROPLASTY     right & left    TUBAL LIGATION  1985    Social History   Socioeconomic History   Marital status: Widowed     Spouse name: Not on file   Number of children: Not on file   Years of education: Not on file   Highest education level: Not on file  Occupational History   Not on file  Tobacco Use   Smoking status: Former    Current packs/day: 0.00    Average packs/day: 1 pack/day for 45.7 years (45.7 ttl pk-yrs)    Types: Cigarettes    Start date: 48    Quit date: 04/13/2020    Years since quitting: 4.2   Smokeless tobacco: Never   Tobacco comments:    Smoked 2PPD at her heaviest  Vaping Use   Vaping status: Never Used  Substance and Sexual Activity   Alcohol use: Never   Drug use: Never   Sexual activity: Not Currently    Birth control/protection: None  Other Topics Concern   Not on file  Social History Narrative   Not on file   Social Drivers of Health   Tobacco Use: Medium Risk (06/29/2024)   Patient History    Smoking Tobacco Use: Former    Smokeless Tobacco Use: Never    Passive Exposure: Not on Actuary Strain: Not on file  Food Insecurity: Not on file  Transportation Needs: Not on file  Physical Activity: Not on file  Stress: Not on file  Social Connections: Not on file  Intimate Partner Violence: Not on file  Depression (EYV7-0): Not on file  Alcohol Screen: Not on file  Housing: Not on file  Utilities: Not on file  Health Literacy: Not on file     Allergies[2]   CBC    Component Value Date/Time   WBC 8.5 10/16/2023 0750   RBC 4.64 10/16/2023 0750   HGB 14.2 10/16/2023 0750   HCT 41.8 10/16/2023 0750   PLT 256 10/16/2023 0750   MCV 90.1 10/16/2023 0750   MCH 30.6 10/16/2023 0750   MCHC 34.0 10/16/2023 0750   RDW 12.9 10/16/2023 0750   LYMPHSABS 1.9 06/06/2021 0446   MONOABS 0.5 06/06/2021 0446   EOSABS 0.1 06/06/2021 0446   BASOSABS 0.0 06/06/2021 0446    Pulmonary Functions Testing Results:    Latest Ref Rng & Units 06/29/2024    9:40 AM  PFT Results  FVC-Pre L 2.10   FVC-Predicted Pre % 69   FVC-Post L 2.08   FVC-Predicted Post  % 68   Pre FEV1/FVC % % 66   Post FEV1/FCV % % 69   FEV1-Pre L 1.40   FEV1-Predicted Pre % 60   FEV1-Post L 1.44   DLCO uncorrected ml/min/mmHg 11.91   DLCO [facility]% % 62   DLVA Predicted % 70   TLC L 5.29  TLC % Predicted % 107   RV % Predicted % 152     Outpatient Medications Prior to Visit  Medication Sig Dispense Refill   albuterol  (VENTOLIN  HFA) 108 (90 Base) MCG/ACT inhaler Inhale 2 puffs into the lungs every 4 (four) hours.     cyclobenzaprine (FLEXERIL) 10 MG tablet Take 10 mg by mouth 2 (two) times daily as needed.     ibuprofen (ADVIL) 800 MG tablet Take 800 mg by mouth 3 (three) times daily. (Patient taking differently: Take 800 mg by mouth every 8 (eight) hours as needed for headache.)     Misc Natural Products (TURMERIC, CURCUMIN, PO) Take 1 capsule by mouth daily in the afternoon.     promethazine  (PHENERGAN ) 12.5 MG tablet Take 1 tablet (12.5 mg total) by mouth every 8 (eight) hours as needed for nausea or vomiting. 30 tablet 0   aspirin  EC 81 MG tablet Take 1 tablet (81 mg total) by mouth daily. Swallow whole.     No facility-administered medications prior to visit.      [1]  Current Outpatient Medications:    albuterol  (VENTOLIN  HFA) 108 (90 Base) MCG/ACT inhaler, Inhale 2 puffs into the lungs every 4 (four) hours., Disp: , Rfl:    cyclobenzaprine (FLEXERIL) 10 MG tablet, Take 10 mg by mouth 2 (two) times daily as needed., Disp: , Rfl:    ibuprofen (ADVIL) 800 MG tablet, Take 800 mg by mouth 3 (three) times daily. (Patient taking differently: Take 800 mg by mouth every 8 (eight) hours as needed for headache.), Disp: , Rfl:    Misc Natural Products (TURMERIC, CURCUMIN, PO), Take 1 capsule by mouth daily in the afternoon., Disp: , Rfl:    promethazine  (PHENERGAN ) 12.5 MG tablet, Take 1 tablet (12.5 mg total) by mouth every 8 (eight) hours as needed for nausea or vomiting., Disp: 30 tablet, Rfl: 0   umeclidinium-vilanterol (ANORO ELLIPTA ) 62.5-25 MCG/ACT AEPB, Inhale 1  puff into the lungs daily., Disp: 28 each, Rfl: 11 [2]  Allergies Allergen Reactions   Rofecoxib Other (See Comments)    Combination of depakote & vioxx caused fluid retention in lungs and surrounding the heart   Sulfa Antibiotics Anaphylaxis, Anxiety, Hives, Hypertension, Itching, Palpitations, Rash, Shortness Of Breath and Swelling   Tramadol Nausea Only   Depakote [Divalproex Sodium] Other (See Comments)    Combination of depakote & vioxx caused fluid retention in lungs and surrounding the heart    Meloxicam Other (See Comments)    Vaginal bleeding   Zofran  [Ondansetron  Hcl] Other (See Comments)    Severe headaches/migraines

## 2024-06-29 NOTE — Patient Instructions (Signed)
°  VISIT SUMMARY: Today, we discussed your ongoing health concerns, including your chronic obstructive pulmonary disease (COPD) with emphysema, obstructive sleep apnea, and obesity. We reviewed the results of your recent tests and scans, and we talked about your symptoms and treatment options.  YOUR PLAN: -CHRONIC OBSTRUCTIVE PULMONARY DISEASE WITH EMPHYSEMA: COPD with emphysema is a lung condition that makes it hard to breathe due to damaged air sacs. Your CT scan shows emphysema with stable pulmonary nodules, and your pulmonary function tests indicate an obstructive defect. We prescribed Anoro Ellipta , one puff daily, to help manage your symptoms. If your insurance does not cover this medication, please contact your pharmacy for alternatives.  -OBSTRUCTIVE SLEEP APNEA, MODERATE: Obstructive sleep apnea is a condition where your breathing stops and starts during sleep. Your sleep study confirmed moderate sleep apnea. We referred you to a sleep specialist to discuss using a CPAP machine and the possibility of taking tirzepatide to help with weight loss, which may reduce your need for CPAP.  -OBESITY: Obesity can contribute to sleep apnea and heart failure. We recommend weight loss to improve your overall health and reduce your symptoms. We discussed weight loss medication options with the sleep specialist, including tirzepatide.  INSTRUCTIONS: Please follow up with the sleep specialist to discuss CPAP therapy and weight loss medication options. If Anoro Ellipta  is not covered by your insurance, contact your pharmacy for alternative medications.         Contains text generated by [employer].

## 2024-06-29 NOTE — Patient Instructions (Signed)
 Full PFT completed today ? ?

## 2024-07-18 ENCOUNTER — Ambulatory Visit (INDEPENDENT_AMBULATORY_CARE_PROVIDER_SITE_OTHER): Admitting: Sleep Medicine

## 2024-07-18 ENCOUNTER — Encounter: Payer: Self-pay | Admitting: Sleep Medicine

## 2024-07-18 VITALS — BP 100/60 | HR 82 | Temp 98.0°F | Ht 63.0 in | Wt 271.4 lb

## 2024-07-18 DIAGNOSIS — G4733 Obstructive sleep apnea (adult) (pediatric): Secondary | ICD-10-CM | POA: Diagnosis not present

## 2024-07-18 DIAGNOSIS — Z6841 Body Mass Index (BMI) 40.0 and over, adult: Secondary | ICD-10-CM

## 2024-07-18 DIAGNOSIS — Z87891 Personal history of nicotine dependence: Secondary | ICD-10-CM | POA: Diagnosis not present

## 2024-07-18 NOTE — Patient Instructions (Addendum)

## 2024-07-18 NOTE — Progress Notes (Signed)
 "      Name:Chloe Farley MRN: [id number] DOB: 11-10-1960   CHIEF COMPLAINT:  EXCESSIVE DAYTIME SLEEPINESS   HISTORY OF PRESENT ILLNESS: Ms. Jurek is a 64 y.o. w/ a h/o COPD and morbid obesity who present for c/o loud snoring and occasional daytime sleepiness which has been present for several years. Reports nocturnal awakenings due to nocturia, however does not have difficulty falling back to sleep. Reports significant weight changes. Denies morning headaches, RLS symptoms, dream enactment, cataplexy, hypnagogic or hypnapompic hallucinations. Reports a family history of sleep apnea. Denies drowsy driving. Drinks 2 cups of coffee daily, occasional alcohol use, denies tobacco or illicit drug use.   The patient underwent HST on 06/05/24 which revealed moderate OSA (AHI 19, O2 nadir 70%).   Bedtime 7-9 pm Sleep onset 10 mins Rise time 3-4 am   EPWORTH SLEEP SCORE 6    07/18/2024    9:00 AM  Results of the Epworth flowsheet  Sitting and reading 2  Watching TV 0  Sitting, inactive in a public place (e.g. a theatre or a meeting) 0  As a passenger in a car for an hour without a break 2  Lying down to rest in the afternoon when circumstances permit 2  Sitting and talking to someone 0  Sitting quietly after a lunch without alcohol 0  In a car, while stopped for a few minutes in traffic 0  Total score 6    PAST MEDICAL HISTORY :   has a past medical history of Dyspnea, Hyperlipidemia, Osteoarthritis of both knees, and Vitamin D deficiency.  has a past surgical history that includes Total hip arthroplasty and Tubal ligation (1985). Prior to Admission medications  Medication Sig Start Date End Date Taking? Authorizing Provider  albuterol  (VENTOLIN  HFA) 108 (90 Base) MCG/ACT inhaler Inhale 2 puffs into the lungs every 4 (four) hours.   Yes [provider]  cyclobenzaprine (FLEXERIL) 10 MG tablet Take 10 mg by mouth 2 (two) times daily as needed. 11/30/23  Yes [provider]  ibuprofen (ADVIL) 800 MG tablet Take 800 mg by mouth 3 (three) times daily. Patient taking differently: Take 800 mg by mouth every 8 (eight) hours as needed for headache. 12/21/23  Yes [provider]  Misc Natural Products (TURMERIC, CURCUMIN, PO) Take 1 capsule by mouth daily in the afternoon.   Yes [provider]  promethazine  (PHENERGAN ) 12.5 MG tablet Take 1 tablet (12.5 mg total) by mouth every 8 (eight) hours as needed for nausea or vomiting. 10/16/23  Yes Dicky Anes, MD  umeclidinium-vilanterol (ANORO ELLIPTA ) 62.5-25 MCG/ACT AEPB Inhale 1 puff into the lungs daily. 06/29/24  Yes Isadora Hose, MD   Allergies[1]  FAMILY HISTORY:  family history includes Cancer in her mother; Head & neck cancer in her brother; Heart attack in her father; Hyperlipidemia in her father and sister; Hypertension in her sister. SOCIAL HISTORY:  reports that she quit smoking about 4 years ago. Her smoking use included cigarettes. She started smoking about 50 years ago. She has a 45.7 pack-year smoking history. She has never used smokeless tobacco. She reports that she does not drink alcohol and does not use drugs.   Review of Systems:  Gen:  Denies  fever, sweats, chills weight loss  HEENT: Denies blurred vision, double vision, ear pain, eye pain, hearing loss, nose bleeds, sore throat Cardiac:  No dizziness, chest pain or heaviness, chest tightness,edema, No JVD Resp:   No cough, -sputum production, -shortness of breath,-wheezing, -hemoptysis,  Gi:  Denies swallowing difficulty, stomach pain, nausea or vomiting, diarrhea, constipation, bowel incontinence Gu:  Denies bladder incontinence, burning urine Ext:   Denies Joint pain, stiffness or swelling Skin: Denies  skin rash, easy bruising or bleeding or hives Endoc:  Denies polyuria, polydipsia , polyphagia or weight change Psych:   Denies depression, insomnia or hallucinations  Other:  All other systems negative  VITAL SIGNS: BP  100/60   Pulse 82   Temp 98 F (36.7 C)   Ht 5' 3 (1.6 m)   Wt 271 lb 6.4 oz (123.1 kg)   SpO2 94%   BMI 48.08 kg/m    Physical Examination:   General Appearance: No distress  EYES PERRLA, EOM intact.   NECK Supple, No JVD Pulmonary: normal breath sounds, No wheezing.  CardiovascularNormal S1,S2.  No m/r/g.   Abdomen: Benign, Soft, non-tender. Skin:   warm, no rashes, no ecchymosis  Extremities: normal, no cyanosis, clubbing. Neuro:without focal findings,  speech normal  PSYCHIATRIC: Mood, affect within normal limits.   ASSESSMENT AND PLAN  OSA Reviewed HST results with patient. Starting on APAP therapy set to 4-16 cm H2O.  Discussed the consequences of untreated sleep apnea. Advised not to drive drowsy for safety of patient and others. Will follow up in 3 months.    Morbid obesity Counseled patient on diet and lifestyle modification.    Patient satisfied with Plan of action and management. All questions answered  I spent a total of 61 minutes reviewing chart data, face-to-face evaluation with the patient, counseling and coordination of care as detailed above.    Avram Danielson, M.D.  Sleep Medicine [facility]           [1]  Allergies Allergen Reactions   Rofecoxib Other (See Comments)    Combination of depakote & vioxx caused fluid retention in lungs and surrounding the heart   Sulfa Antibiotics Anaphylaxis, Anxiety, Hives, Hypertension, Itching, Palpitations, Rash, Shortness Of Breath and Swelling   Tramadol Nausea Only   Depakote [Divalproex Sodium] Other (See Comments)    Combination of depakote & vioxx caused fluid retention in lungs and surrounding the heart    Meloxicam Other (See Comments)    Vaginal bleeding   Zofran  [Ondansetron  Hcl] Other (See Comments)    Severe headaches/migraines   "

## 2024-08-30 ENCOUNTER — Ambulatory Visit

## 2024-10-18 ENCOUNTER — Ambulatory Visit: Admitting: Sleep Medicine

## 2025-01-05 ENCOUNTER — Ambulatory Visit: Admitting: Student in an Organized Health Care Education/Training Program
